# Patient Record
Sex: Female | Born: 1990 | Race: White | Hispanic: No | Marital: Single | Smoking: Never smoker
Health system: Southern US, Academic
[De-identification: ages and names within clinical notes are randomized; demographics above are authoritative.]

## PROBLEM LIST (undated history)

## (undated) DIAGNOSIS — J45909 Unspecified asthma, uncomplicated: Secondary | ICD-10-CM

## (undated) DIAGNOSIS — K219 Gastro-esophageal reflux disease without esophagitis: Secondary | ICD-10-CM

## (undated) DIAGNOSIS — C439 Malignant melanoma of skin, unspecified: Secondary | ICD-10-CM

## (undated) DIAGNOSIS — F411 Generalized anxiety disorder: Secondary | ICD-10-CM

## (undated) HISTORY — PX: SKIN CANCER EXCISION: SHX779

## (undated) HISTORY — PX: HX TONSILLECTOMY: SHX27

---
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
## 2021-01-07 IMAGING — CT CT SINUS W/O CONTRAST
3 series · 17 of 47 positions shown, 20 images · non-contrast
Comparison: None available.

﻿EXAM:  CT SINUS W/O CONTRAST
INDICATION: Chronic maxillary sinusitis, headaches.
TECHNIQUE: Axial noncontrast CT imaging of the paranasal sinuses was performed. Images were reviewed in multiple windows and projections. Exam was performed using 1 or more of the following dose reduction techniques: Automated exposure control, adjustment of the mA and/or kV according to patient size, or the use of iterative reconstruction technique.

[Series 8024: ax · axial · 0.27mm/px · z∈[+113,+206]mm · 11 of 55 slices shown, 14 images]
[im 4/55  brain]
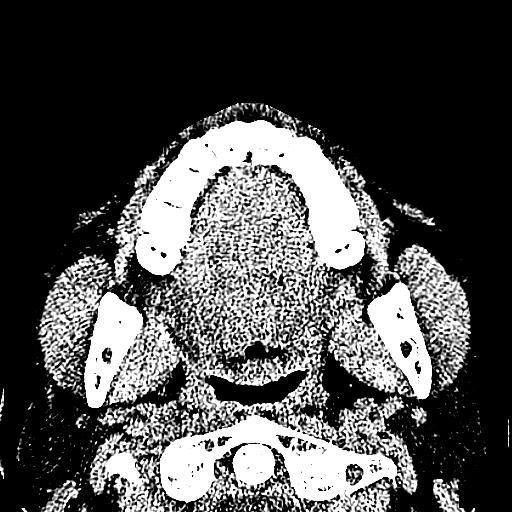
[im 4/55  bone]
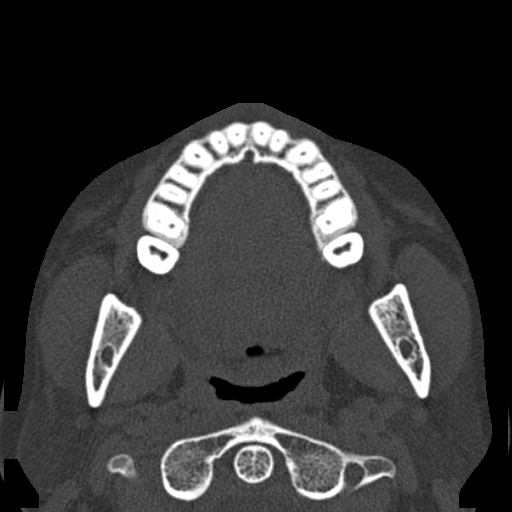
[im 8/55  bone]
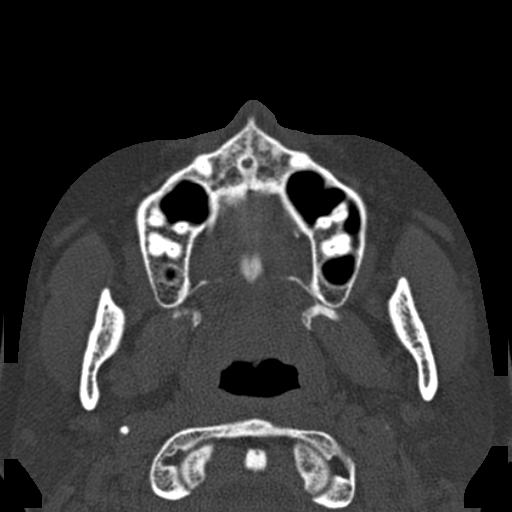
[im 14/55  bone]
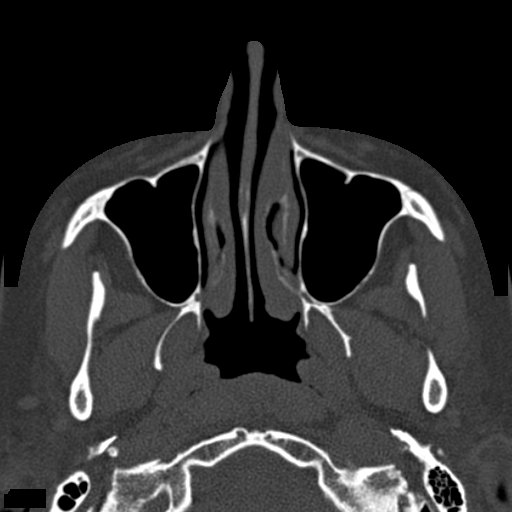
[im 17/55  bone]
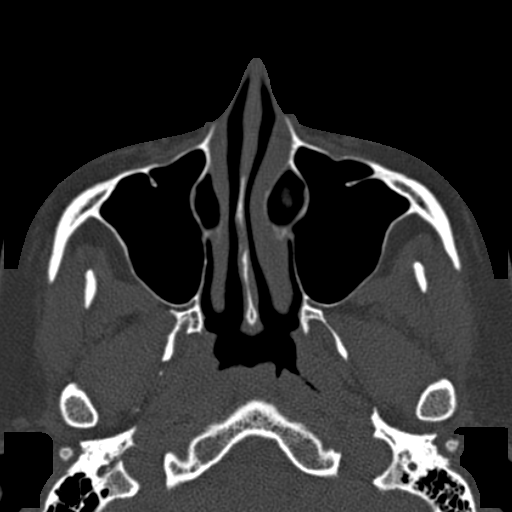
[im 23/55  brain]
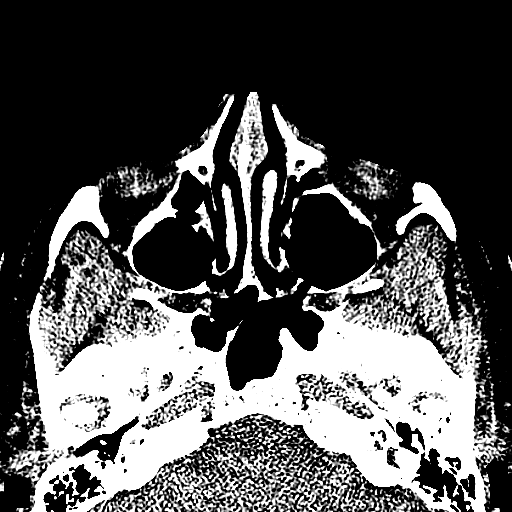
[im 23/55  bone]
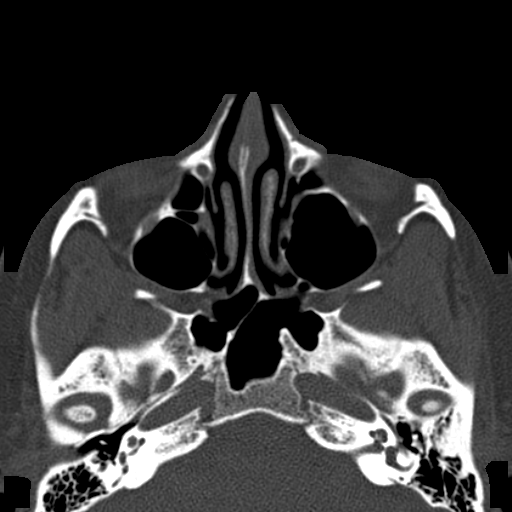
[im 28/55  bone]
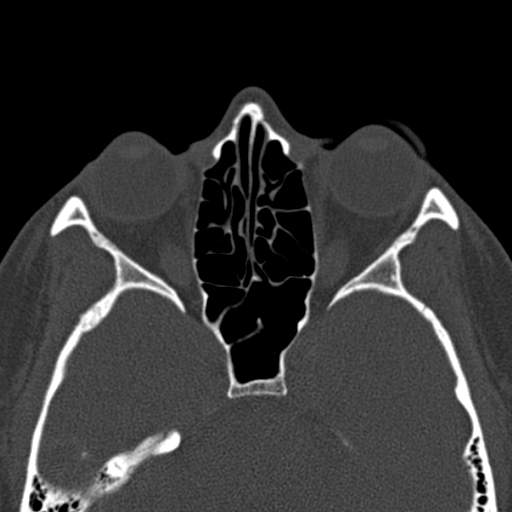
[im 32/55  bone]
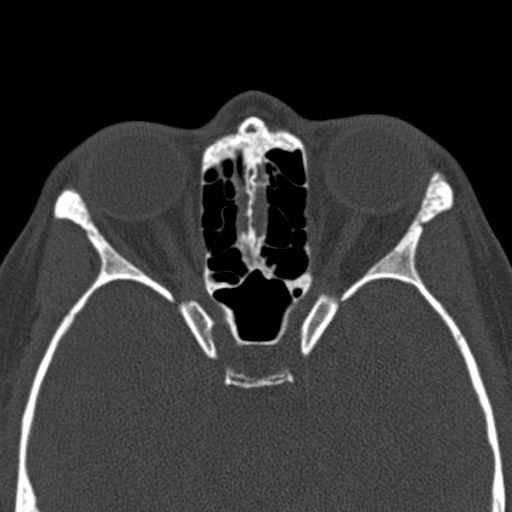
[im 38/55  bone]
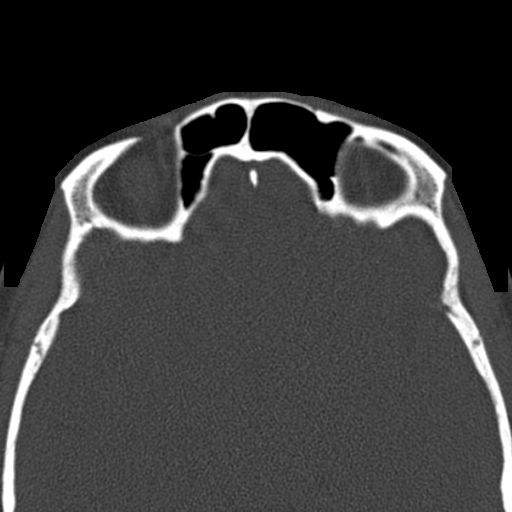
[im 41/55  brain]
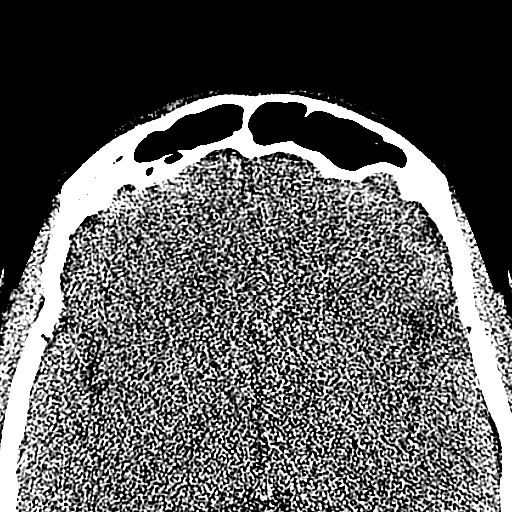
[im 41/55  bone]
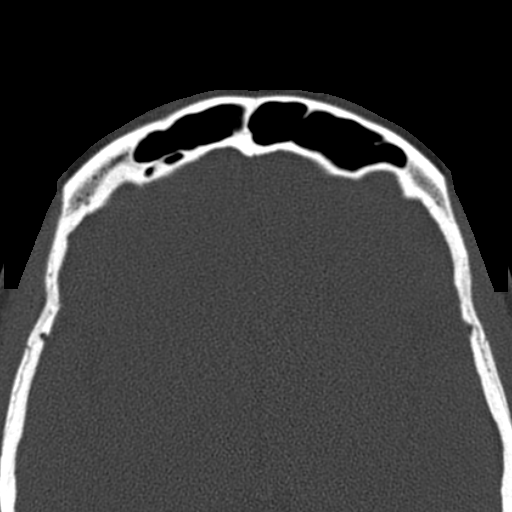
[im 47/55  bone]
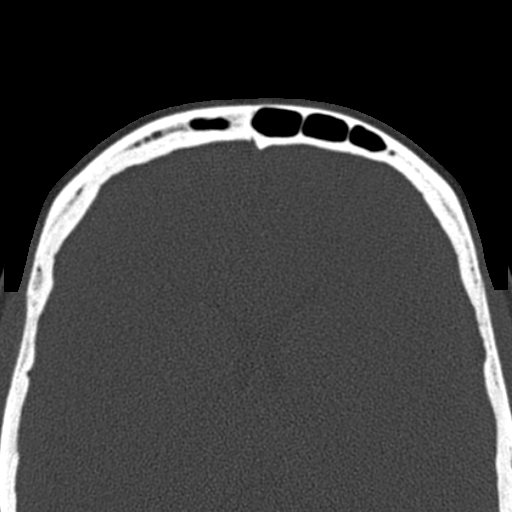
[im 51/55  bone]
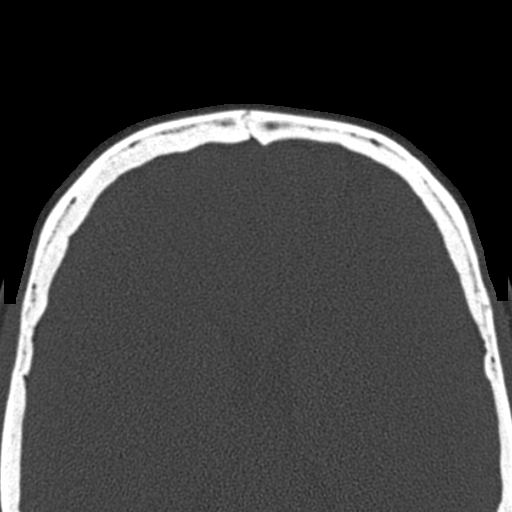

[Series 8025: cor · coronal · 0.25mm/px · 3 of 49 slices shown]
[im 17/49  bone]
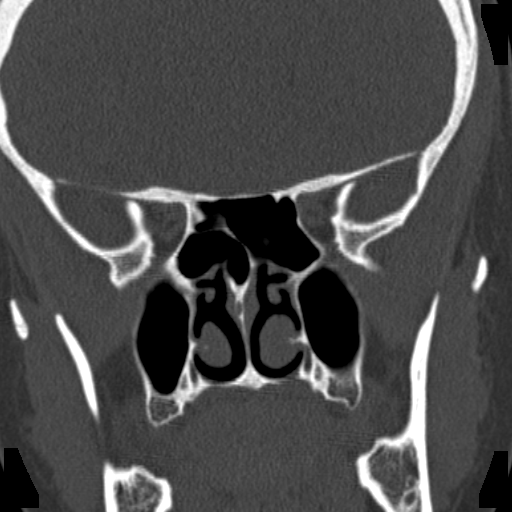
[im 22/49  bone]
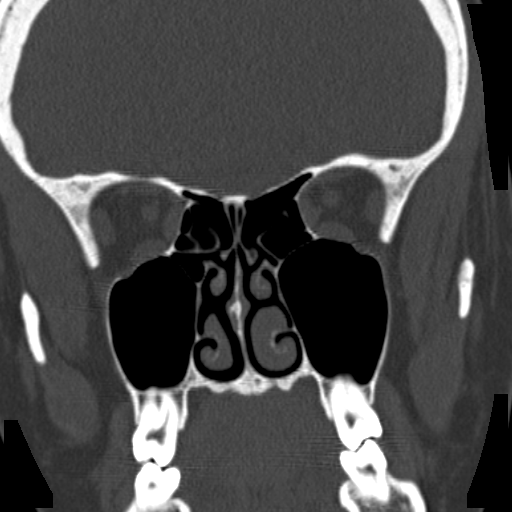
[im 27/49  bone]
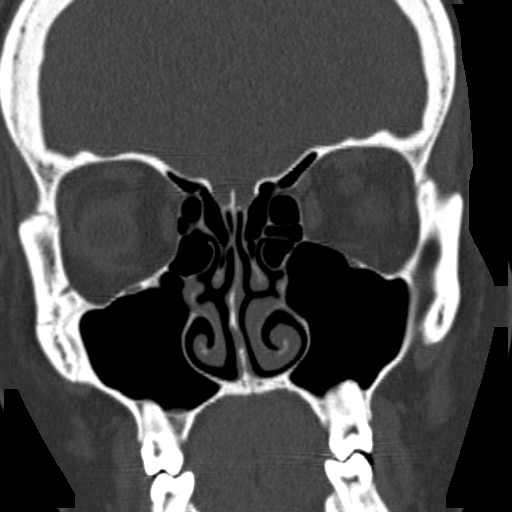

[Series 8026: sag · sagittal · 0.29mm/px · 3 of 59 slices shown]
[im 20/59  bone]
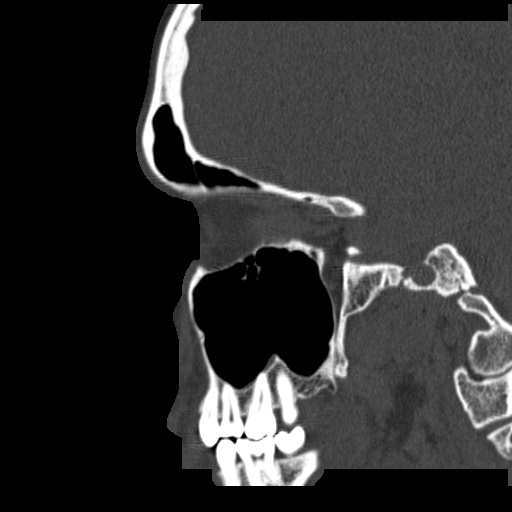
[im 30/59  bone]
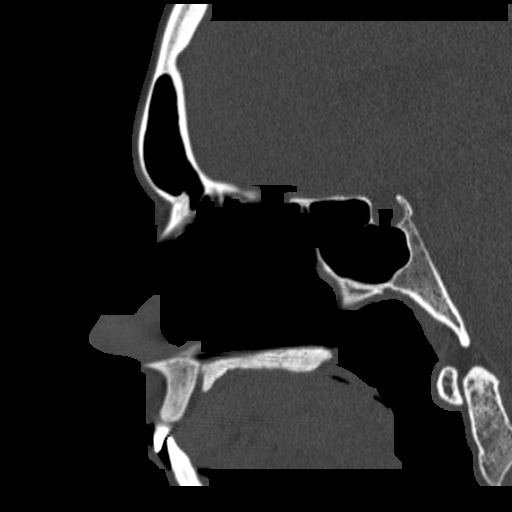
[im 39/59  bone]
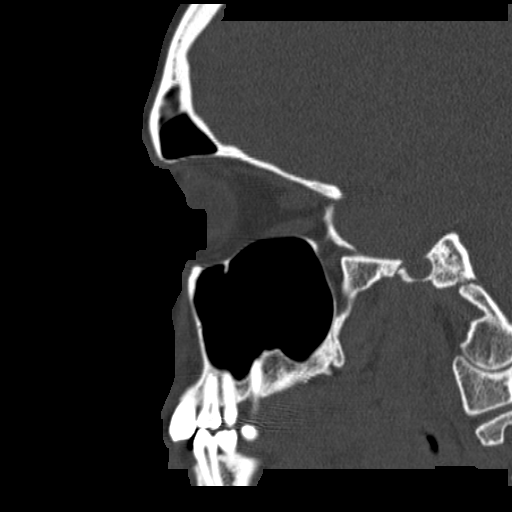

[17 of 47 positions shown; findings below may reference images not displayed]

FINDINGS: Frontal, ethmoid, maxillary and sphenoid sinuses are well aerated without significant opacification or mucoperiosteal thickening. Ostiomeatal units are also patent. There is no significant congestion of the nasal turbinates. Nasal septum is midline. Orbital contents appear unremarkable.
IMPRESSION: Unremarkable exam.

## 2021-03-24 IMAGING — MR MRI CERVICAL SPINE WITHOUT CONTRAST
4 of 5 series · 26 of 48 positions shown · IV contrast (gadolinium)
Comparison: None available.

﻿EXAM:  24050   MRI CERVICAL SPINE WITHOUT CONTRAST
INDICATION: Pain, recent injury.
TECHNIQUE: Multiplanar multisequential MRI of the cervical spine was performed without gadolinium contrast.

[Series 5: T2 · sagittal · 3.0mm · 0.78mm/px · 7 of 11 slices shown (1 of 2)]
[im 1/11]
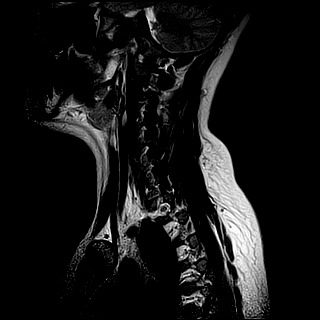
[im 2/11]
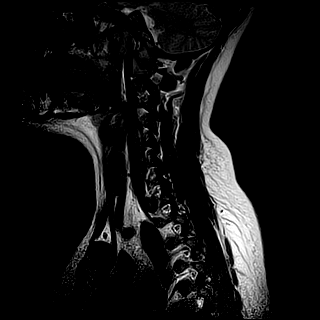
[im 4/11]
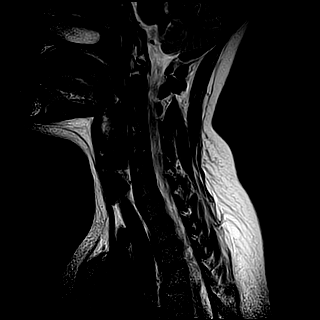
[im 6/11]
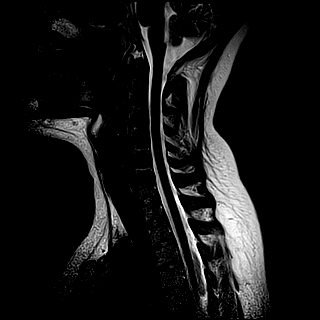
[im 7/11]
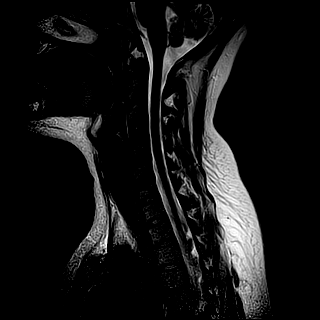
[im 9/11]
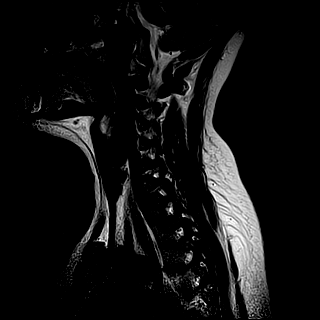
[im 11/11]
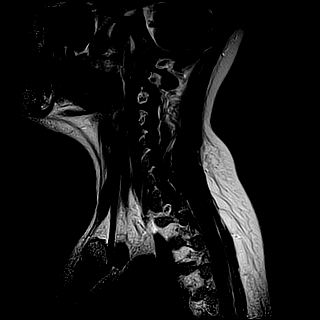

[Series 6: T1 · sagittal · 3.0mm · 0.49mm/px · 6 of 11 slices shown]
[im 1/11]
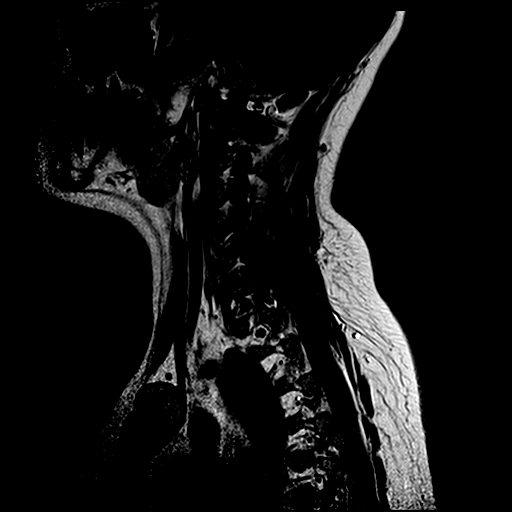
[im 2/11]
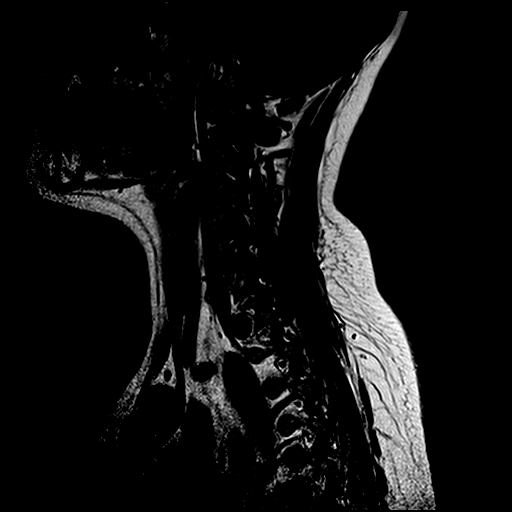
[im 4/11]
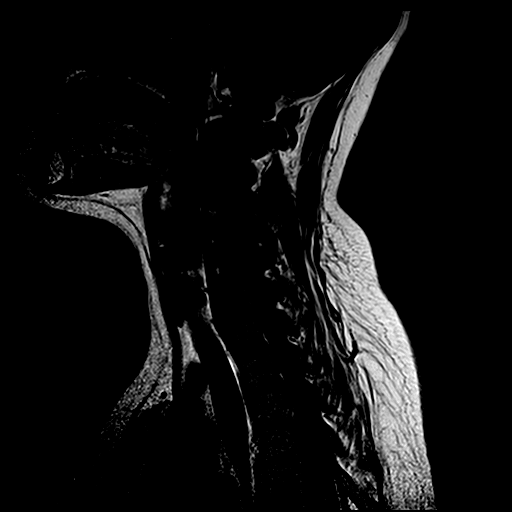
[im 6/11]
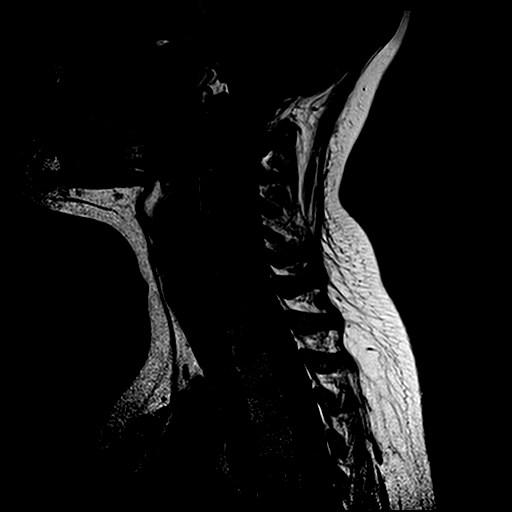
[im 7/11]
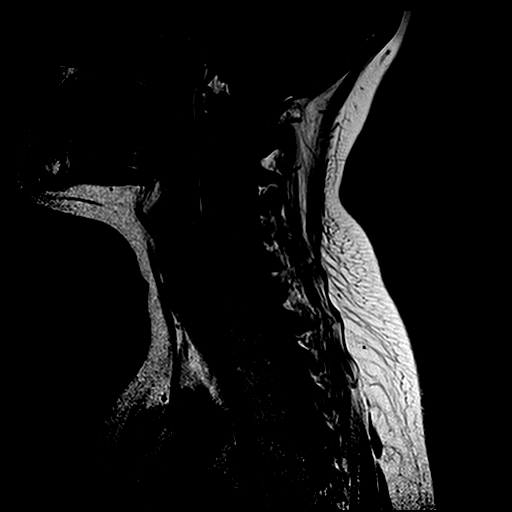
[im 9/11]
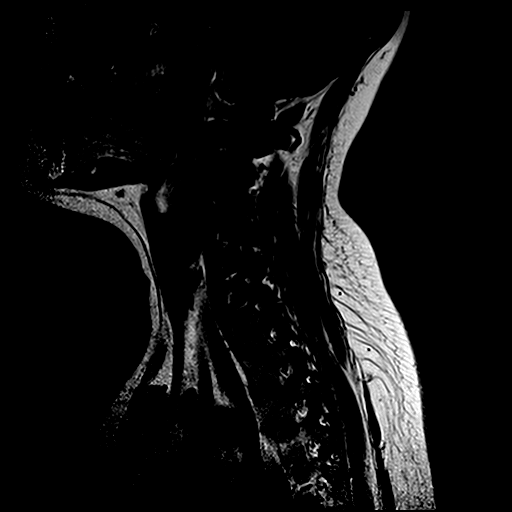

[Series 7: STIR · sagittal · 3.0mm · 0.49mm/px · 3 of 11 slices shown]
[im 2/11]
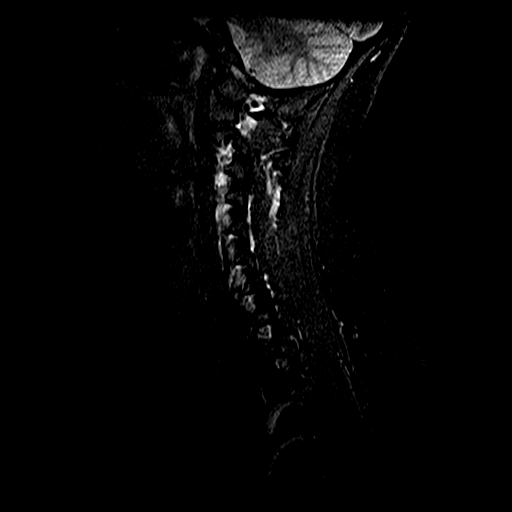
[im 6/11]
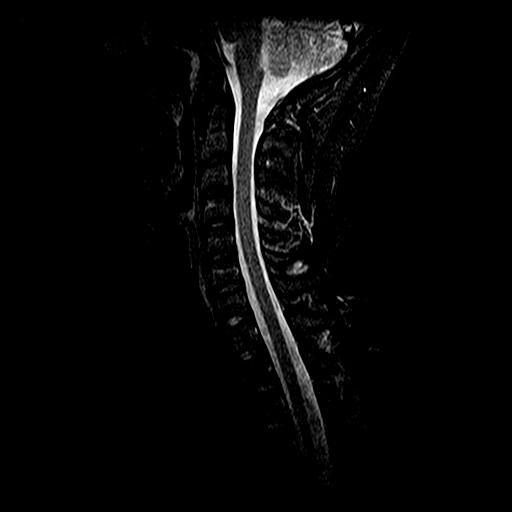
[im 9/11]
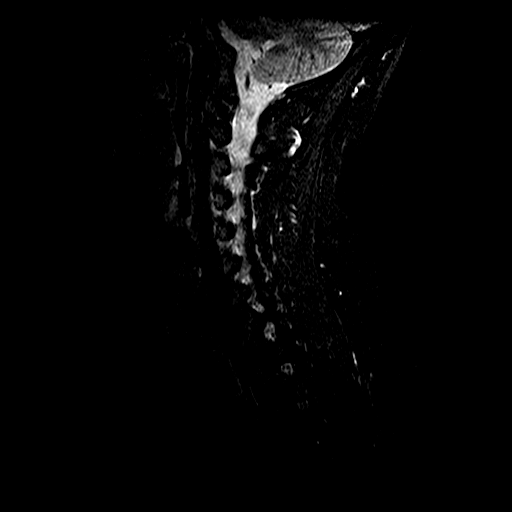

[Series 8: T2 · axial · 3.5mm · 0.40mm/px · z∈[-19,+63]mm · 10 of 26 slices shown (2 of 2)]
[im 2/26]
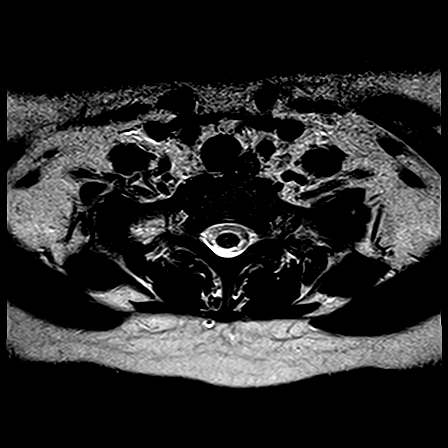
[im 4/26]
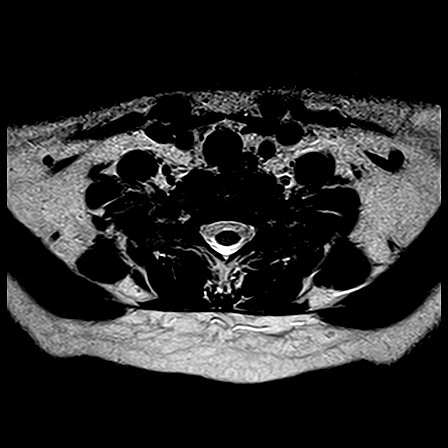
[im 6/26]
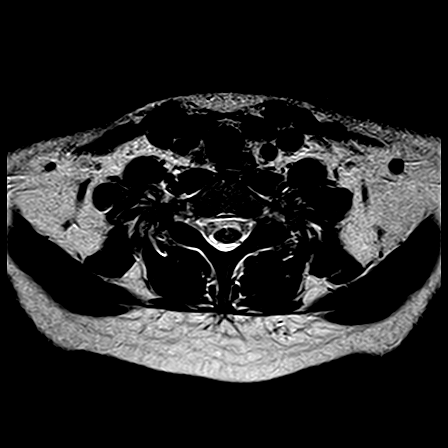
[im 9/26]
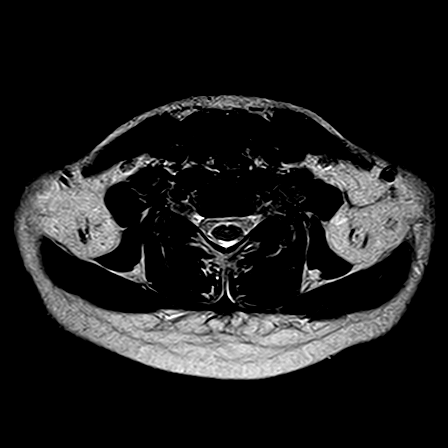
[im 12/26]
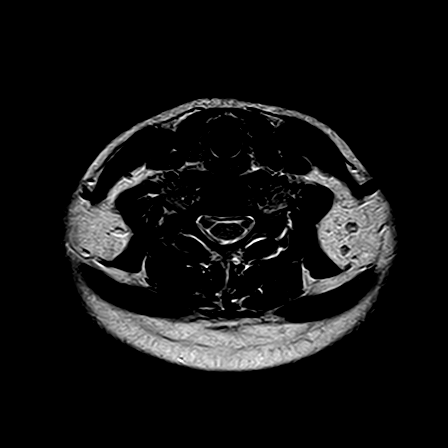
[im 14/26]
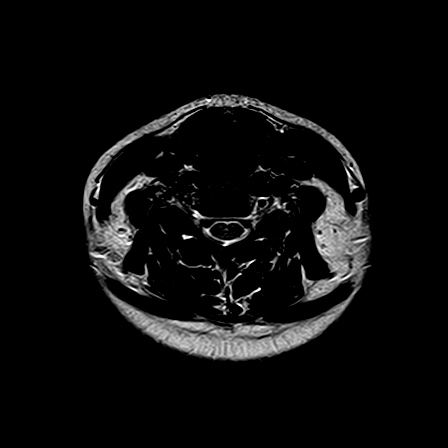
[im 16/26]
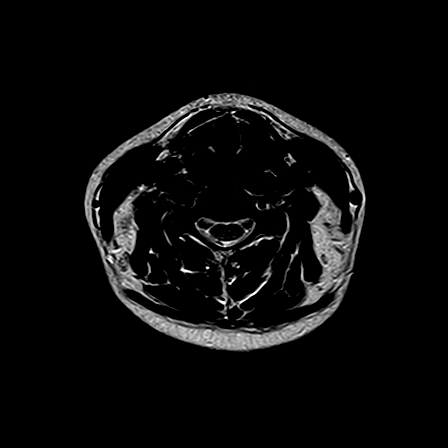
[im 19/26]
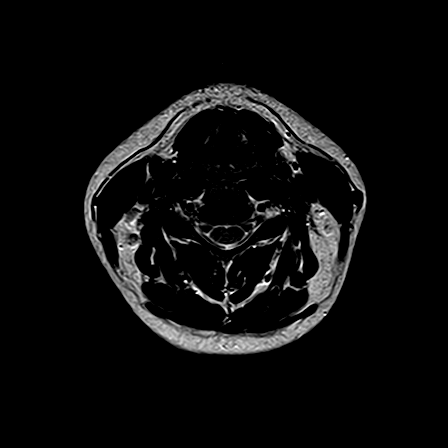
[im 22/26]
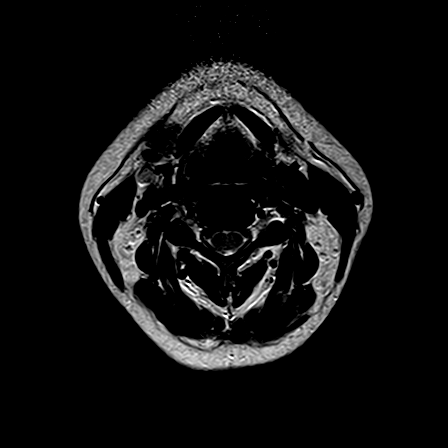
[im 26/26]
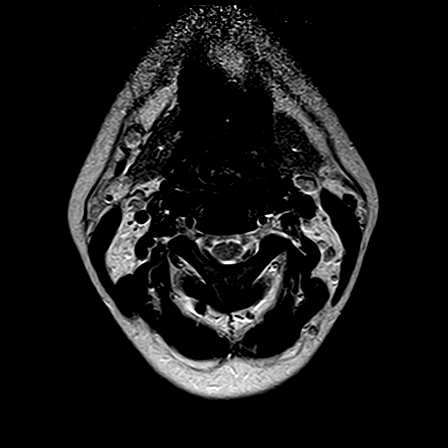

[26 of 48 positions shown; findings below may reference images not displayed]

FINDINGS: Vertebral bodies are normal in height, alignment and signal intensity.  There is no acute fracture or subluxation. Visualized spinal cord is normal in signal intensity without evidence of compression at any level. 

There is no significant disc herniation, spinal canal or neural foraminal stenosis at any level. 

Paraspinal soft tissues are also unremarkable.
IMPRESSION: Unremarkable exam.

## 2021-03-24 IMAGING — MR MRI THORACIC SPINE WITHOUT CONTRAST
4 of 5 series · 26 of 48 positions shown · IV contrast (gadolinium)
Comparison: None available.

﻿EXAM:  15163   MRI THORACIC SPINE WITHOUT CONTRAST
INDICATION: Back pain, lifting injury.
TECHNIQUE: Multiplanar multisequential MRI of the thoracic spine was performed without gadolinium contrast.

[Series 5: T2 · sagittal · 4.0mm · 0.78mm/px · 8 of 13 slices shown (1 of 2)]
[im 1/13]
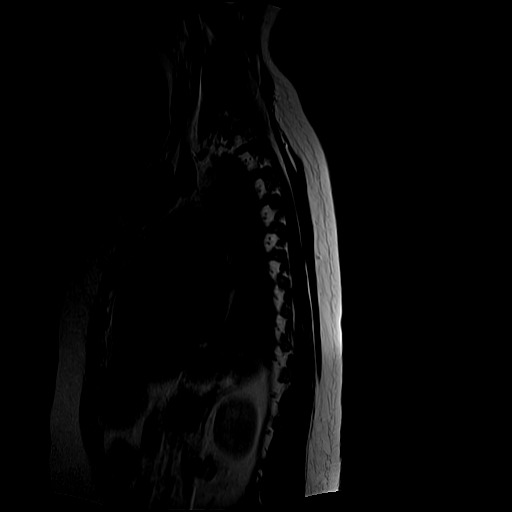
[im 2/13]
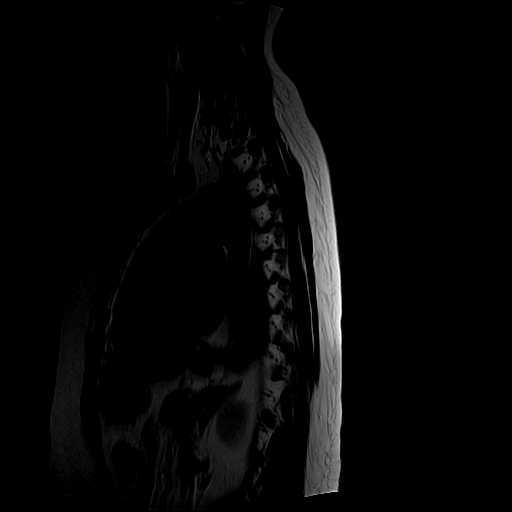
[im 5/13]
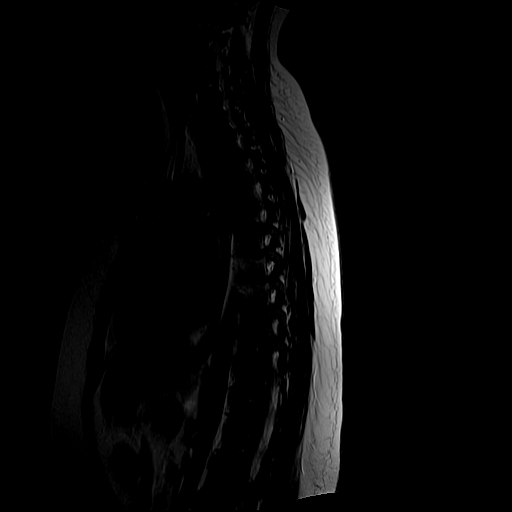
[im 6/13]
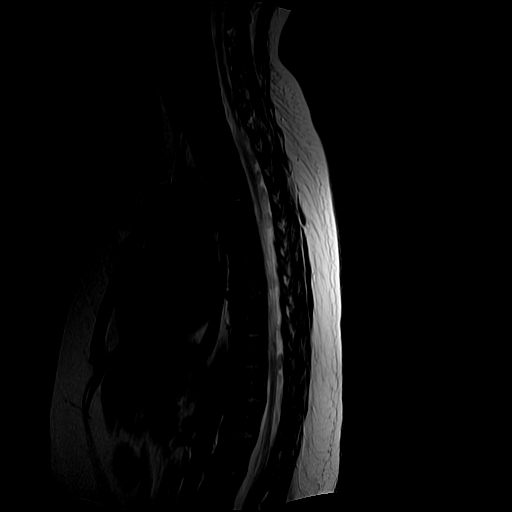
[im 7/13]
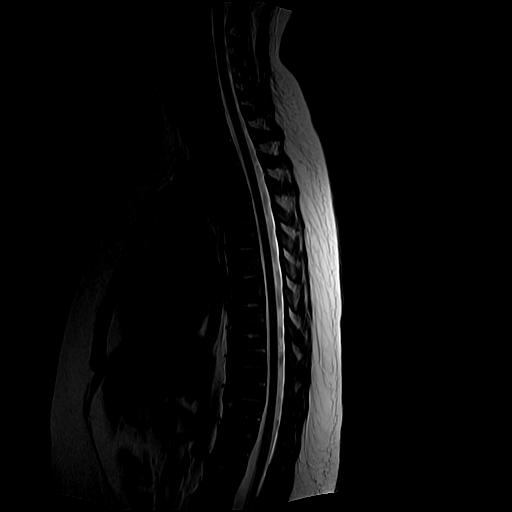
[im 9/13]
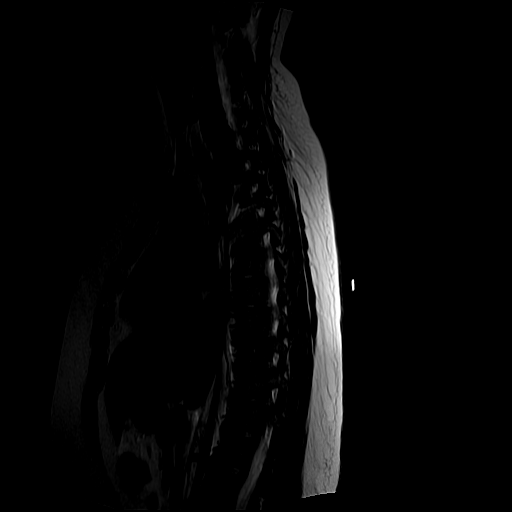
[im 11/13]
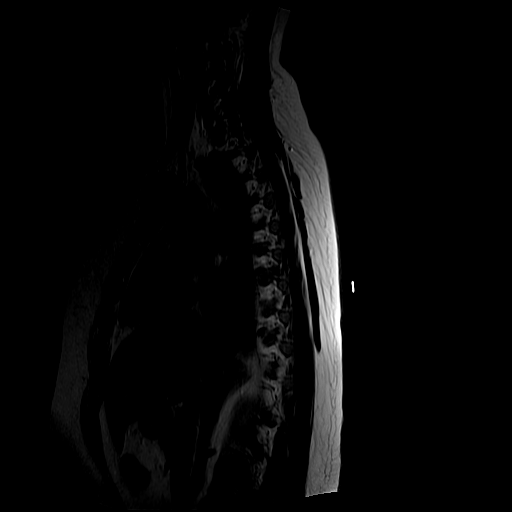
[im 13/13]
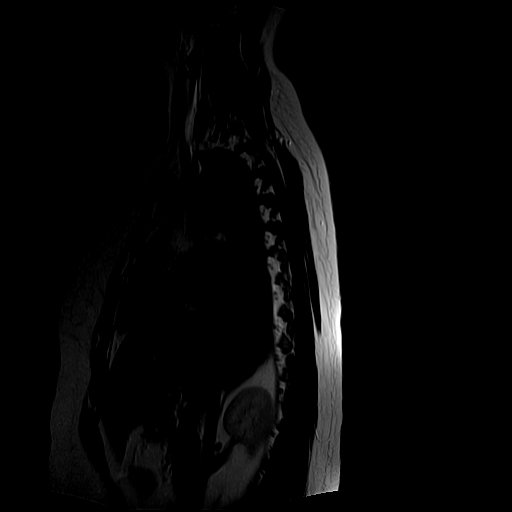

[Series 6: T1 · sagittal · 4.0mm · 0.78mm/px · 6 of 13 slices shown (1 of 2)]
[im 1/13]
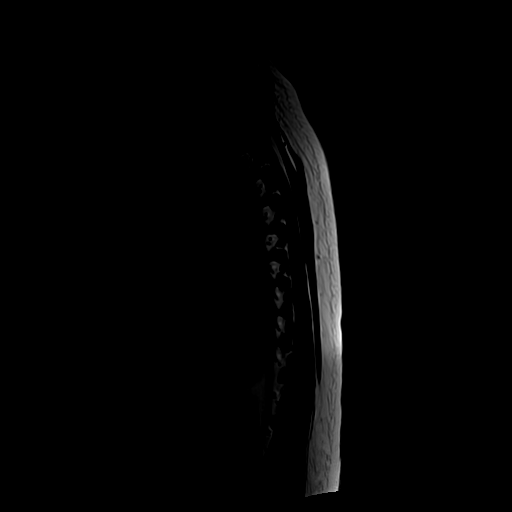
[im 2/13]
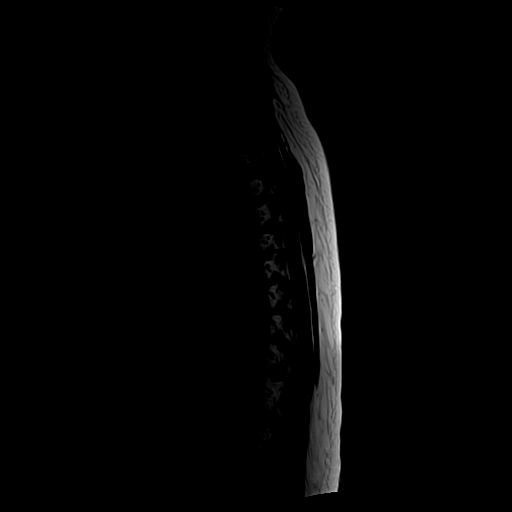
[im 5/13]
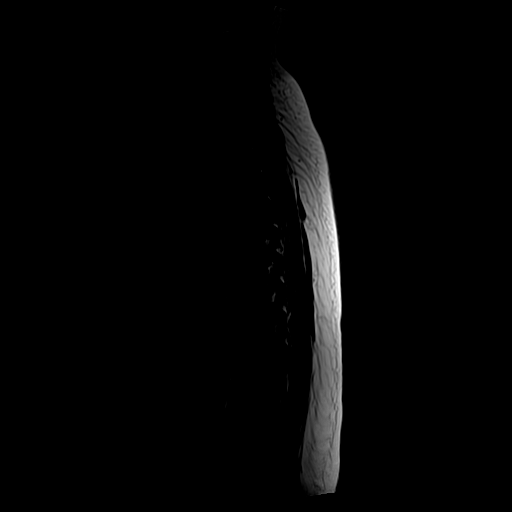
[im 6/13]
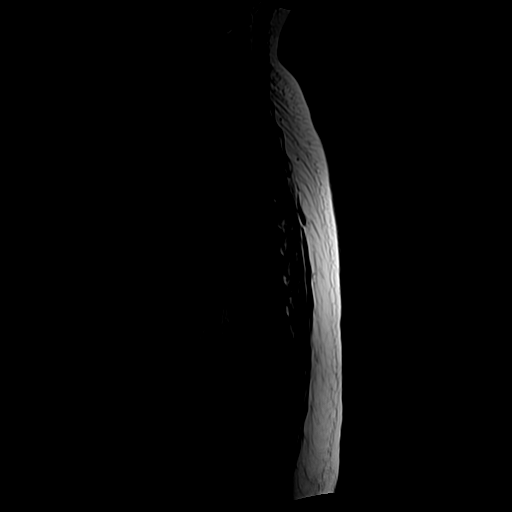
[im 7/13]
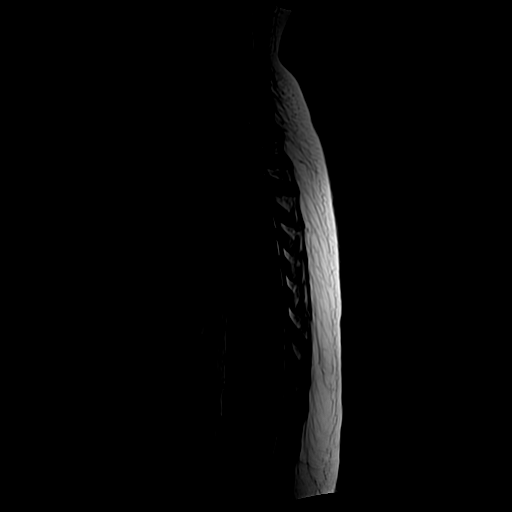
[im 11/13]
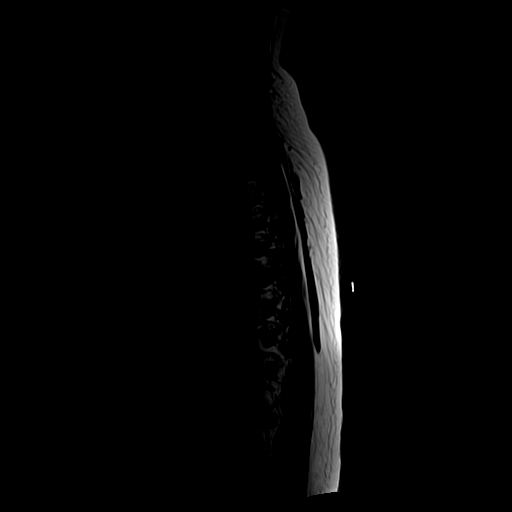

[Series 8: T2 · axial · 4.0mm · 0.62mm/px · z∈[-286,-101]mm · 9 of 12 slices shown (2 of 2)]
[im 1/12]
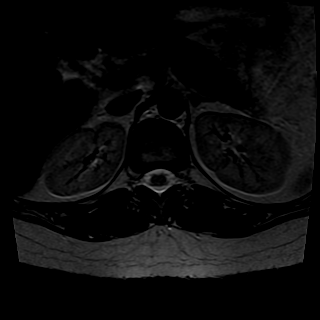
[im 2/12]
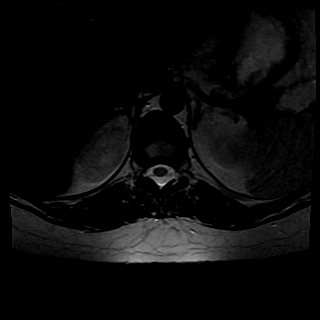
[im 3/12]
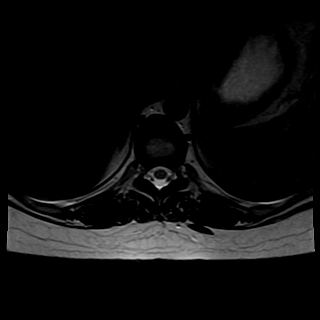
[im 5/12]
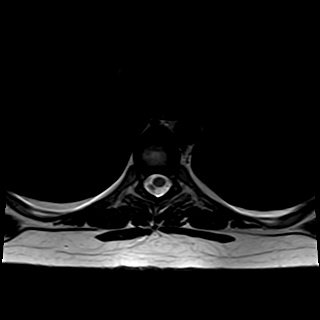
[im 6/12]
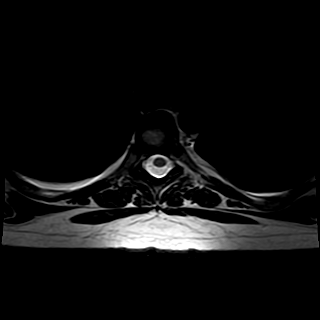
[im 7/12]
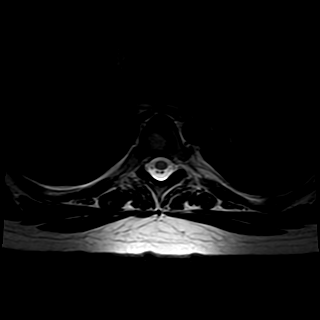
[im 9/12]
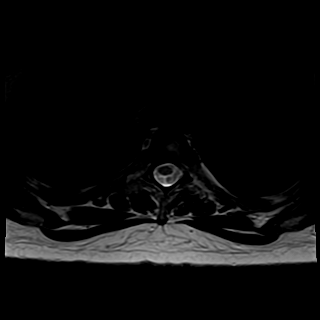
[im 10/12]
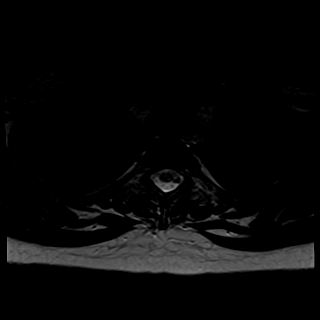
[im 12/12]
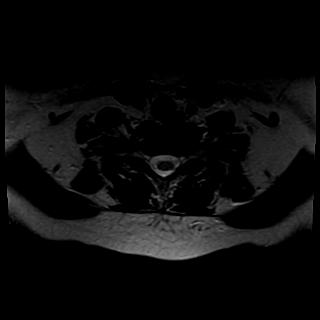

[Series 9: T1 · axial · 4.0mm · 0.62mm/px · z∈[-271,-140]mm · 3 of 12 slices shown (2 of 2)]
[im 2/12]
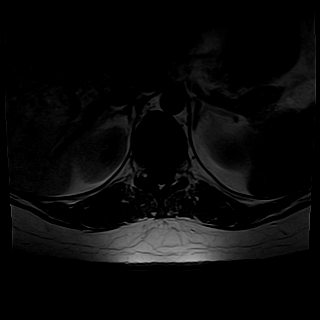
[im 6/12]
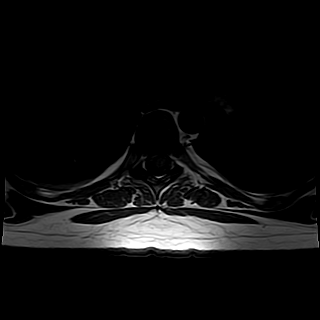
[im 10/12]
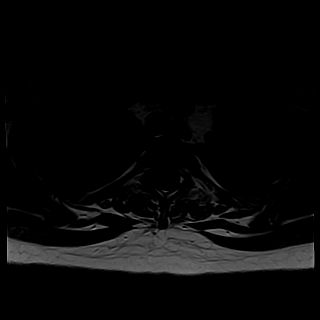

[26 of 48 positions shown; findings below may reference images not displayed]

FINDINGS: Vertebral bodies are normal in height, alignment and signal intensity. There is no acute fracture or subluxation. Visualized spinal cord is normal in signal intensity without evidence of compression at any level. 

There is no significant disc herniation, spinal canal or neural foraminal stenosis at any level. 

Paraspinal soft tissues are also unremarkable.  There is no pleural effusion.
IMPRESSION: Unremarkable exam.

## 2021-06-06 ENCOUNTER — Encounter (HOSPITAL_BASED_OUTPATIENT_CLINIC_OR_DEPARTMENT_OTHER): Payer: Self-pay

## 2021-06-06 ENCOUNTER — Other Ambulatory Visit: Payer: Self-pay

## 2021-06-06 ENCOUNTER — Emergency Department (HOSPITAL_BASED_OUTPATIENT_CLINIC_OR_DEPARTMENT_OTHER): Payer: Medicaid - Out of State

## 2021-06-06 ENCOUNTER — Emergency Department
Admission: EM | Admit: 2021-06-06 | Discharge: 2021-06-06 | Disposition: A | Discharge status: Home / Self Care | Payer: Medicaid - Out of State | Attending: Emergency Medicine | Admitting: Emergency Medicine

## 2021-06-06 DIAGNOSIS — I493 Ventricular premature depolarization: Secondary | ICD-10-CM | POA: Insufficient documentation

## 2021-06-06 DIAGNOSIS — F418 Other specified anxiety disorders: Secondary | ICD-10-CM

## 2021-06-06 DIAGNOSIS — R079 Chest pain, unspecified: Secondary | ICD-10-CM | POA: Insufficient documentation

## 2021-06-06 DIAGNOSIS — R002 Palpitations: Secondary | ICD-10-CM | POA: Insufficient documentation

## 2021-06-06 DIAGNOSIS — R000 Tachycardia, unspecified: Secondary | ICD-10-CM | POA: Insufficient documentation

## 2021-06-06 DIAGNOSIS — F419 Anxiety disorder, unspecified: Secondary | ICD-10-CM | POA: Insufficient documentation

## 2021-06-06 DIAGNOSIS — M79602 Pain in left arm: Secondary | ICD-10-CM

## 2021-06-06 HISTORY — DX: Unspecified asthma, uncomplicated: J45.909

## 2021-06-06 HISTORY — DX: Malignant melanoma of skin, unspecified (CMS HCC): C43.9

## 2021-06-06 HISTORY — DX: Gastro-esophageal reflux disease without esophagitis: K21.9

## 2021-06-06 HISTORY — DX: Generalized anxiety disorder: F41.1

## 2021-06-06 LAB — CBC WITH DIFF
BASOPHIL #: 0.03 10*3/uL (ref 0.00–0.30)
BASOPHIL %: 0 % (ref 0–3)
EOSINOPHIL #: 0.26 10*3/uL (ref 0.00–0.80)
EOSINOPHIL %: 2 % (ref 0–7)
HCT: 49 % — ABNORMAL HIGH (ref 37.0–47.0)
HGB: 16.3 g/dL — ABNORMAL HIGH (ref 12.5–16.0)
LYMPHOCYTE #: 3.1 10*3/uL (ref 1.10–5.00)
LYMPHOCYTE %: 29 % (ref 25–45)
MCH: 30.5 pg (ref 27.0–32.0)
MCHC: 33.3 g/dL (ref 32.0–36.0)
MCV: 91.5 fL (ref 78.0–99.0)
MONOCYTE #: 0.86 10*3/uL (ref 0.00–1.30)
MONOCYTE %: 8 % (ref 0–12)
MPV: 9 fL (ref 7.4–10.4)
NEUTROPHIL #: 6.29 10*3/uL (ref 1.80–8.40)
NEUTROPHIL %: 60 % (ref 40–76)
PLATELETS: 320 10*3/uL (ref 140–440)
RBC: 5.35 10*6/uL (ref 4.20–5.40)
RDW: 15.3 % — ABNORMAL HIGH (ref 11.6–14.8)
WBC: 10.5 10*3/uL (ref 4.0–10.5)

## 2021-06-06 LAB — D-DIMER: D-DIMER: 473 ng/mL DDU — ABNORMAL HIGH (ref <=232)

## 2021-06-06 LAB — COMPREHENSIVE METABOLIC PANEL, NON-FASTING
ALBUMIN/GLOBULIN RATIO: 1.2 (ref 0.8–1.4)
ALBUMIN: 3.7 g/dL (ref 3.4–5.0)
ALKALINE PHOSPHATASE: 73 U/L (ref 46–116)
ALT (SGPT): 31 U/L (ref <=78)
ANION GAP: 9 mmol/L — ABNORMAL LOW (ref 10–20)
AST (SGOT): 21 U/L (ref 15–37)
BILIRUBIN TOTAL: 0.4 mg/dL (ref 0.2–1.0)
BUN/CREA RATIO: 16
BUN: 16 mg/dL (ref 7–18)
CALCIUM, CORRECTED: 9.2 mg/dL
CALCIUM: 8.9 mg/dL (ref 8.5–10.1)
CHLORIDE: 106 mmol/L (ref 98–107)
CO2 TOTAL: 24 mmol/L (ref 21–32)
CREATININE: 0.97 mg/dL (ref 0.55–1.02)
ESTIMATED GFR: 81 mL/min/{1.73_m2} (ref >59)
GLOBULIN: 3.2
GLUCOSE: 86 mg/dL (ref 74–106)
OSMOLALITY, CALCULATED: 278 mOsm/kg (ref 270–290)
POTASSIUM: 3.6 mmol/L (ref 3.5–5.1)
PROTEIN TOTAL: 6.9 g/dL (ref 6.4–8.2)
SODIUM: 139 mmol/L (ref 136–145)

## 2021-06-06 LAB — TROPONIN-I: TROPONIN I: <2 ng/L (ref <15)

## 2021-06-06 LAB — HCG QUALITATIVE PREGNANCY, SERUM: PREGNANCY, SERUM QUALITATIVE: NEGATIVE

## 2021-06-06 MED ORDER — ASPIRIN 81 MG CHEWABLE TABLET
324.0000 mg | CHEWABLE_TABLET | ORAL | Status: DC
Start: 2021-06-06 — End: 2021-06-06
  Administered 2021-06-06: 0 mg via ORAL

## 2021-06-06 NOTE — ED Triage Notes (Signed)
PT states chest pains since mid day yesterday, left sided over left breast that does not radiate. PT states at times she feels palpitations as well. Pain worsens with exertion. Hx chest pains and has been worked up, has been told it could be her anxiety disorder as well. Denies n/v or cough.

## 2021-06-06 NOTE — Discharge Instructions (Signed)
You were seen emergency department for chest pain.  Your evaluation did not reveal critical condition requiring hospitalization.  Please follow-up primary care ideally within next 2-3 days.  Return to emergency department to get worsening chest pain shortness breath or any other concerning symptoms.  Thank you for visiting [facility].

## 2021-06-06 NOTE — ED Nurses Note (Signed)
PT dc'd to home with self via ambulatory. DC instructions discussed with teach back to confirm understanding. Dc'd iv from pt left ac. Cath intact. Pressure dressing applied. Bleeding controlled.

## 2021-06-06 NOTE — ED Provider Notes (Signed)
[facility], [facility] Emergency Department  ED Primary Provider Note  HPI:  Leslie Wells is a 31 y.o. female     Patient complains of palpitations and pain under her left under arm.  This has been ongoing for about 2 days.  She is not short of breath.  Denies nausea vomiting belly pain.  Patient states she does have a history of anxiety but believes she may have POTS.  She is never received a formal diagnosis.  She is been previously evaluated by Cardiology for arrhythmia and found to have few PVCs.  Patient has taken Vistaril today with little relief of symptoms.  She is no history of venous thromboembolism.  Denies long periods of immobilization or use of hormonal birth control.  Patient is COVID vaccinated.  Full code.    ROS review and negative aside from stated in HPI.    Physical Exam:  ED Triage Vitals [06/06/21 2000]   BP (Non-Invasive) 113/81   Heart Rate (!) 117   Respiratory Rate 18   Temperature 36.7 C (98.1 F)   SpO2 100 %   Weight 77.1 kg (170 lb)   Height 1.6 m ('5\' 3"' )     No acute distress.  Patient awake alert oriented x3.  Mood is appropriate.  Pupils 3 mm equal round reactive.  Extraocular movements are intact.  Oropharynx is clear.  Mucous membranes moist.  Trachea midline.  Neck is supple.  Heart has regular rate and rhythm without significant murmurs rubs or gallops.  Palpation of left-sided chest wall/axilla does partially reproduce patient's symptoms.  Lungs are clear to auscultation.  Abdomen soft nontender, nondistended.  Moving all extremities without difficulty.  No rash no edema.      Patient data:  Labs Ordered/Reviewed   COMPREHENSIVE METABOLIC PANEL, NON-FASTING - Abnormal; Notable for the following components:       Result Value    ANION GAP 9 (*)     All other components within normal limits    Narrative:     Estimated Glomerular Filtration Rate (eGFR) is calculated using the CKD-EPI (2021) equation, intended for patients 74 years of age and  older. If gender is not documented or "unknown", there will be no eGFR calculation.   CBC WITH DIFF - Abnormal; Notable for the following components:    HGB 16.3 (*)     HCT 49.0 (*)     RDW 15.3 (*)     All other components within normal limits   TROPONIN-I - Normal    Narrative:     Values received on females ranging between 12-15 ng/L MUST include the next serial troponin to review changes in the delta differences as the reference range for the Access II chemistry analyzer is lower than the established reference range.     HCG QUALITATIVE PREGNANCY, SERUM - Normal   CBC/DIFF    Narrative:     The following orders were created for panel order CBC/DIFF.  Procedure                               Abnormality         Status                     ---------                               -----------         ------  CBC WITH CVKF[[id number]]                Abnormal            Final result                 Please view results for these tests on the individual orders.   D-DIMER     XR AP MOBILE CHEST   Final Result by Edi, Radresults In (05/06 2100)   NEGATIVE CHEST         Radiologist location ID: [id number]             EKG read by me shows sinus normal axis, QTC is 447.  I do not appreciate significant ST or T-wave changes no delta waves are S1 Q3 T3 pattern.    MDM:  Chest pain palpitations.  Low suspicion for ACS PE malignant arrhythmia.  Anxiety is likely a strong contributor.  Possible GERD.  Vitals are within normal limits.  Benign exam.  EKG nonischemic.  Chest x-ray unremarkable.  Troponin within normal limits.  Patient  have a slightly elevated dimer to 473 but it is not at a level significant for embolic phenomenon..  PE is highly unlikely.  I did review these findings with the patient and answered her questions.  Patient took aspirin prior to arrival.  I have asked her to follow closely with primary care and Cardiology and I gave strict return to ED instructions in both a verbal and written  manner.  Patient comfortable with discharge.    Data Unavailable  Clinical Impression   None     Medications Administered in the ED   aspirin chewable tablet 324 mg (0 mg Oral Not Given 06/06/21 2100)        Current Discharge Medication List      CONTINUE these medications - NO CHANGES were made during your visit.      Details   busPIRone 30 mg Tablet  Commonly known as: BUSPAR   30 mg, Oral, DAILY  Refills: 0     cetirizine 10 mg Tablet  Commonly known as: ZYRTEC   10 mg, Oral, DAILY  Refills: 0     hydrOXYzine pamoate 25 mg Capsule  Commonly known as: VISTARIL   25 mg, Oral, 3 TIMES DAILY PRN  Refills: 0     magnesium oxide 400 mg Tablet  Commonly known as: MAG-OX   400 mg, Oral, 2 TIMES DAILY  Refills: 0     montelukast 10 mg Tablet  Commonly known as: SINGULAIR   10 mg, Oral, EVERY EVENING  Refills: 0     omeprazole 20 mg Capsule, Delayed Release(E.C.)  Commonly known as: PRILOSEC   20 mg, Oral, DAILY  Refills: 0     topiramate 25 mg Tablet  Commonly known as: TOPAMAX   25 mg, Oral, 2 TIMES DAILY  Refills: 0     venlafaxine 150 mg Capsule, Sust. Release 24 hr  Commonly known as: EFFEXOR XR   150 mg, Oral, DAILY  Refills: 0

## 2021-06-06 NOTE — ED Nurses Note (Signed)
Pt reports she self took 650 mg of ASA today PTA

## 2021-06-07 LAB — ECG 12 LEAD
Atrial Rate: 104 {beats}/min
Calculated P Axis: 47 degrees
Calculated R Axis: 65 degrees
Calculated T Axis: 48 degrees
PR Interval: 142 ms
QRS Duration: 82 ms
QT Interval: 340 ms
QTC Calculation: 447 ms
Ventricular rate: 104 {beats}/min

## 2021-07-10 ENCOUNTER — Encounter (HOSPITAL_BASED_OUTPATIENT_CLINIC_OR_DEPARTMENT_OTHER): Payer: Self-pay

## 2021-07-10 ENCOUNTER — Other Ambulatory Visit: Payer: Self-pay

## 2021-07-10 ENCOUNTER — Emergency Department
Admission: EM | Admit: 2021-07-10 | Discharge: 2021-07-10 | Disposition: A | Discharge status: Home / Self Care | Payer: Medicaid - Out of State | Attending: Emergency Medicine | Admitting: Emergency Medicine

## 2021-07-10 DIAGNOSIS — N939 Abnormal uterine and vaginal bleeding, unspecified: Secondary | ICD-10-CM | POA: Insufficient documentation

## 2021-07-10 DIAGNOSIS — Z3202 Encounter for pregnancy test, result negative: Secondary | ICD-10-CM | POA: Insufficient documentation

## 2021-07-10 LAB — CBC WITH DIFF
BASOPHIL #: 0.03 10*3/uL (ref 0.00–0.30)
BASOPHIL %: 0 % (ref 0–3)
EOSINOPHIL #: 0.14 10*3/uL (ref 0.00–0.80)
EOSINOPHIL %: 2 % (ref 0–7)
HCT: 41.3 % (ref 37.0–47.0)
HGB: 13.7 g/dL (ref 12.5–16.0)
LYMPHOCYTE #: 2.49 10*3/uL (ref 1.10–5.00)
LYMPHOCYTE %: 34 % (ref 25–45)
MCH: 31 pg (ref 27.0–32.0)
MCHC: 33.2 g/dL (ref 32.0–36.0)
MCV: 93.4 fL (ref 78.0–99.0)
MONOCYTE #: 0.55 10*3/uL (ref 0.00–1.30)
MONOCYTE %: 7 % (ref 0–12)
MPV: 9.4 fL (ref 7.4–10.4)
NEUTROPHIL #: 4.22 10*3/uL (ref 1.80–8.40)
NEUTROPHIL %: 57 % (ref 40–76)
PLATELETS: 297 10*3/uL (ref 140–440)
RBC: 4.42 10*6/uL (ref 4.20–5.40)
RDW: 14.9 % — ABNORMAL HIGH (ref 11.6–14.8)
WBC: 7.4 10*3/uL (ref 4.0–10.5)

## 2021-07-10 LAB — COMPREHENSIVE METABOLIC PANEL, NON-FASTING
ALBUMIN/GLOBULIN RATIO: 1.1 (ref 0.8–1.4)
ALBUMIN: 3.5 g/dL (ref 3.4–5.0)
ALKALINE PHOSPHATASE: 71 U/L (ref 46–116)
ALT (SGPT): 139 U/L — ABNORMAL HIGH (ref <=78)
ANION GAP: 6 mmol/L — ABNORMAL LOW (ref 10–20)
AST (SGOT): 48 U/L — ABNORMAL HIGH (ref 15–37)
BILIRUBIN TOTAL: 0.5 mg/dL (ref 0.2–1.0)
BUN/CREA RATIO: 14
BUN: 11 mg/dL (ref 7–18)
CALCIUM, CORRECTED: 9.1 mg/dL
CALCIUM: 8.6 mg/dL (ref 8.5–10.1)
CHLORIDE: 104 mmol/L (ref 98–107)
CO2 TOTAL: 28 mmol/L (ref 21–32)
CREATININE: 0.78 mg/dL (ref 0.55–1.02)
ESTIMATED GFR: 105 mL/min/{1.73_m2} (ref >59)
GLOBULIN: 3.1
GLUCOSE: 85 mg/dL (ref 74–106)
OSMOLALITY, CALCULATED: 274 mOsm/kg (ref 270–290)
POTASSIUM: 3.9 mmol/L (ref 3.5–5.1)
PROTEIN TOTAL: 6.6 g/dL (ref 6.4–8.2)
SODIUM: 138 mmol/L (ref 136–145)

## 2021-07-10 LAB — URINALYSIS, MICROSCOPIC: RBCS: >50 /hpf — AB

## 2021-07-10 LAB — URINALYSIS, MACROSCOPIC
BILIRUBIN: NEGATIVE mg/dL
GLUCOSE: NEGATIVE mg/dL
KETONES: NEGATIVE mg/dL
NITRITE: NEGATIVE
PH: 7 (ref 4.6–8.0)
PROTEIN: NEGATIVE mg/dL
SPECIFIC GRAVITY: <=1.005 (ref 1.003–1.035)
UROBILINOGEN: 0.2 mg/dL (ref 0.2–1.0)

## 2021-07-10 LAB — HCG, PLASMA OR SERUM QUANTITATIVE, PREGNANCY: HCG QUANTITATIVE PREGNANCY: 0 IU/L (ref <=5)

## 2021-07-10 NOTE — ED Triage Notes (Signed)
4 week OB from home pregnancy test.   States woke this am with vaginal bleeding.  States breast had been swollen and aren't this am.   Lower abdominal cramping.

## 2021-07-10 NOTE — Discharge Instructions (Signed)

## 2021-07-10 NOTE — ED Provider Notes (Signed)
[facility]  ED Primary Provider Note  Patient Name: KATRIANNA FRIESENHAHN  Patient Age: 31 y.o.  Date of Birth: 1990/06/26    Chief Complaint: Vaginal Bleeding - Pregnant        History of Present Illness       Raeanne Deschler Flesch is a 31 y.o. female who had concerns including Vaginal Bleeding - Pregnant.  This patient is a G2P1 at approximately 4 weeks without a confirmed intrauterine pregnancy.  Patient states she is having some lower abdominal related discomfort, and a positive Clear Blue home pregnancy test.  The patient states over the preceding 4 weeks, she has noticed some breast tenderness, which is absent this morning.  She follows with Dr. Oletta Lamas for obstetrics and gynecology care.  Her previous pregnancies reportedly uncomplicated, and she has a 63-year-old son at home.        Review of Systems     No other overt Review of Systems are noted to be positive except noted in the HPI.      Historical Data   History Reviewed This Encounter: Medical History  Surgical History  Family History  Social History        Physical Exam   ED Triage Vitals [07/10/21 0753]   BP (Non-Invasive) 97/69   Heart Rate 91   Respiratory Rate 18   Temperature 36.2 C (97.2 F)   SpO2 98 %   Weight 77.1 kg (170 lb)   Height 1.524 m (5')         Nursing notes reviewed for what could be assessed. Past Medical, Surgical, and Social history reviewed for what has been completed. Exam limited in the setting of personal protective equipment.    Constitutional: NAD. Well-Developed. Well Nourished.  Head: Normocephalic, atraumatic.  Ears:  Normal to exterior evaluation  Eyes: EOM grossly intact, conjunctiva normal.  Neck: Supple  Cardiovascular: Regular Rate and Rhythm, extremities well perfused.  Pulmonary/Chest: No respiratory distress. Lungs are symmetric to auscultation bilaterally.  Abdominal: Soft, no overt focal tenderness, non-distended. Non peritoneal, no rebound, no guarding.  MSK: No Lower  Extremity Edema.  Skin: Warm, dry, and intact  Neuro: Appropriate, CN II-XII grossly intact for what can be assessed.  Psych: Pleasant    GU:  Deferred          Procedures      Patient Data   Labs Ordered/Reviewed - No data to display    No orders to display       Medical Decision Making          MDM      Studies Assessed:  Lab      MDM Narrative:  This patient is a 31 year old female who presents with vaginal bleeding with a positive pregnancy test.  During workup, the patient's blood beta HCG was undetectable.  I discussed with the patient her negative pregnancy test, and encouraged close OB/GYN follow-up.  Notably, the patient's other symptoms have been present for multiple weeks, and there does not appear to be any red flag findings of any ovarian type pathology.  Patient voiced understanding and felt comfortable with the plan.                Following the history, physical exam, and ED workup, the patient was deemed stable and suitable for discharge. The patient/caregiver was advised to return to the ED for any new or worsening symptoms. Discharge medications, and follow-up instructions were discussed with the patient/caregiver in detail, who verbalizes  understanding. The patient/caregiver is in agreement and is comfortable with the plan of care.    Disposition: Discharged         Current Discharge Medication List      CONTINUE these medications - NO CHANGES were made during your visit.      Details   busPIRone 30 mg Tablet  Commonly known as: BUSPAR   30 mg, Oral, DAILY  Refills: 0     cetirizine 10 mg Tablet  Commonly known as: ZYRTEC   10 mg, Oral, DAILY  Refills: 0     hydrOXYzine pamoate 25 mg Capsule  Commonly known as: VISTARIL   25 mg, Oral, 3 TIMES DAILY PRN  Refills: 0     magnesium oxide 400 mg Tablet  Commonly known as: MAG-OX   400 mg, Oral, 2 TIMES DAILY  Refills: 0     montelukast 10 mg Tablet  Commonly known as: SINGULAIR   10 mg, Oral, EVERY EVENING  Refills: 0     omeprazole 20 mg Capsule,  Delayed Release(E.C.)  Commonly known as: PRILOSEC   20 mg, Oral, DAILY  Refills: 0     topiramate 25 mg Tablet  Commonly known as: TOPAMAX   25 mg, Oral, 2 TIMES DAILY  Refills: 0     venlafaxine 150 mg Capsule, Sust. Release 24 hr  Commonly known as: EFFEXOR XR   150 mg, Oral, DAILY  Refills: 0          Follow up:   [place] [id number]  Call today                 Clinical Impression   Vaginal bleeding (Primary)   Negative pregnancy test         Current Discharge Medication List            R. Baldo Daub, MD, [facility]  Department of Emergency Medicine

## 2021-09-09 ENCOUNTER — Ambulatory Visit (INDEPENDENT_AMBULATORY_CARE_PROVIDER_SITE_OTHER): Payer: Medicaid - Out of State | Admitting: OBSTETRICS/GYNECOLOGY

## 2021-09-09 ENCOUNTER — Encounter (INDEPENDENT_AMBULATORY_CARE_PROVIDER_SITE_OTHER): Payer: Self-pay

## 2022-01-14 LAB — RUBELLA VIRUS ANTIBODIES, IGG, SERUM: RUBELLA IGG QUALITATIVE: IMMUNE

## 2022-01-14 LAB — CHLAMYDIA TRACHOMATIS/NEISSERIA GONORRHOEAE RNA, NAAT
CHLAMYDIA TRACHOMATIS RNA: NEGATIVE
NEISSERIA GONORRHEA GC RNA: NEGATIVE

## 2022-01-14 LAB — HEPATITIS B SURFACE ANTIGEN: HEPATITIS B SURFACE AG: NEGATIVE

## 2022-01-14 LAB — HIV1/HIV2 SCREEN, COMBINED ANTIGEN AND ANTIBODY: HIV SCREEN, COMBINED ANTIGEN & ANTIBODY: NEGATIVE

## 2022-01-14 LAB — SYPHILIS SCREENING ALGORITHM WITH REFLEX, SERUM: SYPHILIS TP ANTIBODIES: NONREACTIVE

## 2022-02-01 NOTE — L&D Delivery Note (Signed)
[place] [facility]ICINE [facility]    Delivery Note  PATIENT:  Leslie Wells   MRN:  [id number]   DATE OF SERVICE:  08/13/2022    PRIMARY OB:  Dr. Rockne Menghini, MD FACOG    Preoperative Diagnosis:  1.  Thirty-nine week 2 day  pregnancy  2.  Term labor 3.  Meconium 4.  Terminal bradycardia                                                Postoperative Diagnosis: 1.  Thirty-nine week 2 day  pregnancy now delivered  2.  Term labor 3.  Meconium 4.  Terminal bradycardia                                                Procedure:  Vacuum assisted vaginal delivery    Surgeon:  Dr Rockne Menghini, MD FACOG    Anesthesia:  None    Complications: None    EBL:  300 cc    Fluids:  Crystalloid    Findings:  Patient had an 8 lb 3.5 oz female infant with Apgars of 8 and 9.  The placenta was delivered intact with a three-vessel cord noted.  The placenta and membranes were meconium stained.    Technique:  Leslie Wells is a 32 y.o. [place] 2 now Para 2 who presented to labor and delivery on 08/13/2022 in active labor.  She was 5-6 cm dilated on presentation.  She rapidly became 9 cm dilated 100% effaced and at +1 station.  She underwent amniotomy and meconium stained fluid was noted.  She then became completely dilated 100% effaced and at +1 approaching +2 station.  She began pushing and the fetal heart rate dropped into the 80s and was not recovering.  The head was ROA and at +2 station.  Decision was made to proceed with a vacuum assisted vaginal delivery.  A kiwi vacuum was applied in the standard fashion and pressure was applied into the green zone.  With the 1st pull the vacuum popped off almost immediately.  The vacuum was then immediately replaced and pressure once again applied into the green zone.  With 1 more easy pull the infant's head was then delivered and the vacuum removed.  The infant's mouth and nose was suctioned on the perineum.  The infant was then delivered and the cord clamped and cut.   The infant was handed off to the mother and the waiting nursing staff.  The infant was not 8 lb 3.5 oz female infant with Apgars of 8 and 9.  Cord blood was obtained.  The placenta was then delivered intact with a three-vessel cord noted.  A small second-degree perineal laceration was encountered and this was repaired under local using interrupted sutures of 3-0 chromic.  Estimated blood loss was 300 cc.  There were no apparent complications.  All sponges, needles, and instruments were accounted for.  Post delivery both the mother and infant were in stable condition.      Rockne Menghini, MD [facility]

## 2022-03-20 ENCOUNTER — Other Ambulatory Visit: Payer: Self-pay

## 2022-03-20 ENCOUNTER — Emergency Department
Admission: EM | Admit: 2022-03-20 | Discharge: 2022-03-20 | Disposition: A | Discharge status: Home / Self Care | Payer: MEDICAID | Attending: Family | Admitting: Family

## 2022-03-20 ENCOUNTER — Encounter (HOSPITAL_BASED_OUTPATIENT_CLINIC_OR_DEPARTMENT_OTHER): Payer: Self-pay

## 2022-03-20 DIAGNOSIS — Z3A18 18 weeks gestation of pregnancy: Secondary | ICD-10-CM | POA: Insufficient documentation

## 2022-03-20 DIAGNOSIS — O98812 Other maternal infectious and parasitic diseases complicating pregnancy, second trimester: Secondary | ICD-10-CM

## 2022-03-20 DIAGNOSIS — B379 Candidiasis, unspecified: Secondary | ICD-10-CM | POA: Insufficient documentation

## 2022-03-20 DIAGNOSIS — O26859 Spotting complicating pregnancy, unspecified trimester: Secondary | ICD-10-CM

## 2022-03-20 DIAGNOSIS — O209 Hemorrhage in early pregnancy, unspecified: Secondary | ICD-10-CM | POA: Insufficient documentation

## 2022-03-20 DIAGNOSIS — O26892 Other specified pregnancy related conditions, second trimester: Secondary | ICD-10-CM | POA: Insufficient documentation

## 2022-03-20 LAB — CBC WITH DIFF
BASOPHIL #: 0.03 10*3/uL (ref 0.00–0.30)
BASOPHIL %: 0 % (ref 0–3)
EOSINOPHIL #: 0.27 10*3/uL (ref 0.00–0.80)
EOSINOPHIL %: 2 % (ref 0–7)
HCT: 37.4 % (ref 37.0–47.0)
HGB: 12.6 g/dL (ref 12.5–16.0)
LYMPHOCYTE #: 2.87 10*3/uL (ref 1.10–5.00)
LYMPHOCYTE %: 18 % — ABNORMAL LOW (ref 25–45)
MCH: 31.2 pg (ref 27.0–32.0)
MCHC: 33.7 g/dL (ref 32.0–36.0)
MCV: 92.5 fL (ref 78.0–99.0)
MONOCYTE #: 1.16 10*3/uL (ref 0.00–1.30)
MONOCYTE %: 7 % (ref 0–12)
MPV: 9.6 fL (ref 7.4–10.4)
NEUTROPHIL #: 11.27 10*3/uL — ABNORMAL HIGH (ref 1.80–8.40)
NEUTROPHIL %: 72 % (ref 40–76)
PLATELETS: 289 10*3/uL (ref 140–440)
RBC: 4.04 10*6/uL — ABNORMAL LOW (ref 4.20–5.40)
RDW: 15 % — ABNORMAL HIGH (ref 11.6–14.8)
WBC: 15.6 10*3/uL — ABNORMAL HIGH (ref 4.0–10.5)

## 2022-03-20 LAB — URINALYSIS, MICROSCOPIC: BACTERIA: NEGATIVE /hpf

## 2022-03-20 LAB — URINALYSIS, MACROSCOPIC
BILIRUBIN: NEGATIVE mg/dL
GLUCOSE: NEGATIVE mg/dL
KETONES: NEGATIVE mg/dL
LEUKOCYTES: NEGATIVE WBCs/uL
NITRITE: NEGATIVE
PH: 6 (ref 4.6–8.0)
PROTEIN: NEGATIVE mg/dL
SPECIFIC GRAVITY: <=1.005 (ref 1.003–1.035)
UROBILINOGEN: 0.2 mg/dL (ref 0.2–1.0)

## 2022-03-20 LAB — COMPREHENSIVE METABOLIC PANEL, NON-FASTING
ALBUMIN/GLOBULIN RATIO: 0.8 (ref 0.8–1.4)
ALBUMIN: 3 g/dL — ABNORMAL LOW (ref 3.4–5.0)
ALKALINE PHOSPHATASE: 71 U/L (ref 46–116)
ALT (SGPT): 20 U/L (ref <=78)
ANION GAP: 10 mmol/L (ref 4–13)
AST (SGOT): 8 U/L — ABNORMAL LOW (ref 15–37)
BILIRUBIN TOTAL: 0.2 mg/dL (ref 0.2–1.0)
BUN/CREA RATIO: 15
BUN: 8 mg/dL (ref 7–18)
CALCIUM, CORRECTED: 10 mg/dL
CALCIUM: 9.2 mg/dL (ref 8.5–10.1)
CHLORIDE: 103 mmol/L (ref 98–107)
CO2 TOTAL: 25 mmol/L (ref 21–32)
CREATININE: 0.52 mg/dL — ABNORMAL LOW (ref 0.55–1.02)
ESTIMATED GFR: 127 mL/min/{1.73_m2} (ref >59)
GLOBULIN: 3.6
GLUCOSE: 97 mg/dL (ref 74–106)
OSMOLALITY, CALCULATED: 274 mOsm/kg (ref 270–290)
POTASSIUM: 4.2 mmol/L (ref 3.5–5.1)
PROTEIN TOTAL: 6.6 g/dL (ref 6.4–8.2)
SODIUM: 138 mmol/L (ref 136–145)

## 2022-03-20 LAB — ABO & RH: ABO/RH(D): A POS

## 2022-03-20 LAB — HCG, PLASMA OR SERUM QUANTITATIVE, PREGNANCY: HCG QUANTITATIVE PREGNANCY: 8049 IU/L — ABNORMAL HIGH (ref <=5)

## 2022-03-20 MED ORDER — CLOTRIMAZOLE 1 % TOPICAL CREAM
TOPICAL_CREAM | Freq: Every day | CUTANEOUS | 0 refills | Status: AC
Start: 2022-03-20 — End: 2022-03-27

## 2022-03-20 NOTE — ED Triage Notes (Addendum)
Patient states at approx 1400 today she had sex, started to spot and has been spotting ever since. Is approx 18.[redacted] weeks pregnant. Also having "very light" lower abdominal cramps. States this is her second baby. Light pink/orange color spotting. States bleeding was heavier at first but has let up now. States she also has vaginal irritation, is on antibiotics for an upper respiratory infection. States she thinks she also has a yeast infection.

## 2022-03-20 NOTE — ED Nurses Note (Signed)
Fetal heart tones with doppler performed.

## 2022-03-20 NOTE — ED Provider Notes (Signed)
[facility] Emergency Department  ED Primary Provider Note  History of Present Illness   Chief Complaint   Patient presents with    Vaginal Bleeding - Pregnant     Leslie Wells is a 32 y.o. female who had concerns including Vaginal Bleeding - Pregnant.  Arrival: The patient arrived by Car    Patient is G 2p1 [redacted] week pregnant female to the emergency room with vaginal spotting.  Patient states she had intercourse approximally 4 hours prior to arrival and noticed bleeding immediately after.  Patient states she was bleeding when she wiped with bright red blood on the toilet paper.  Patient states since then she has slowly started to decrease and now has light pink spotting.  Patient states additionally she has some lower abdominal cramping.  Patient denies any passing of tissue or clots.      History Reviewed This Encounter: Medical History  Surgical History  Family History  Social History    Physical Exam   ED Triage Vitals [03/20/22 1807]   BP (Non-Invasive) 127/83   Heart Rate 98   Respiratory Rate 20   Temperature 36.2 C (97.2 F)   SpO2 99 %   Weight 95.3 kg (210 lb)   Height 1.6 m (5' 3"$ )     Physical Exam  Vitals and nursing note reviewed.   Constitutional:       General: She is not in acute distress.     Appearance: She is well-developed.   HENT:      Head: Normocephalic and atraumatic.   Eyes:      Conjunctiva/sclera: Conjunctivae normal.   Cardiovascular:      Rate and Rhythm: Normal rate and regular rhythm.      Heart sounds: No murmur heard.  Pulmonary:      Effort: Pulmonary effort is normal. No respiratory distress.      Breath sounds: Normal breath sounds.   Abdominal:      Palpations: Abdomen is soft.      Tenderness: There is no abdominal tenderness.   Musculoskeletal:         General: No swelling.      Cervical back: Neck supple.   Skin:     General: Skin is warm and dry.      Capillary Refill: Capillary refill takes less than 2 seconds.    Neurological:      Mental Status: She is alert.   Psychiatric:         Mood and Affect: Mood normal.       Patient Data     Labs Ordered/Reviewed   COMPREHENSIVE METABOLIC PANEL, NON-FASTING - Abnormal; Notable for the following components:       Result Value    CREATININE 0.52 (*)     ALBUMIN 3.0 (*)     AST (SGOT) 8 (*)     All other components within normal limits    Narrative:     Estimated Glomerular Filtration Rate (eGFR) is calculated using the CKD-EPI (2021) equation, intended for patients 45 years of age and older. If gender is not documented or "unknown", there will be no eGFR calculation.   HCG, PLASMA OR SERUM QUANTITATIVE, PREGNANCY - Abnormal; Notable for the following components:    HCG QUANTITATIVE PREGNANCY 8,049 (*)     All other components within normal limits    Narrative:  HCG EXPECTED VALUES    NON PREGNANT FEMALE  FEMALE ADULT    THE CONCENTRATION OF HCG RISES RAPIDLY DURING EARLY PREGNANCY. A MAXIMUM LEVEL OF 5,000 to 200,000 mIU/mL IS REACHED AT 10-12 WEEKS, FOLLOWED BY A SLOW DECLINE TO LEVELS OF 1,000 TO 50,000 mIU/mL DURING THE THIRD TRIMESTER.    HCG RESULTS BETWEEN 5 mIU/mL AND 25 mIU/mL MAY BE INDICATIVE OF EARLY PREGNANCY BUT SHOULD BE INTERPRETED IN LIGHT OF THE TOTAL CLINICAL PRESENTATION OF THE PATIENT.    WHEN BORDERLINE RESULTS ARE ENCOUNTERED, PATIENT SAMPLES SHOULD BE REDRAWM 48 HOURS LATER.    Weeks Post LMP                   Approximate hCG                                    Range (mIU/mL)    0.2-1 WK                           5-50              mIU/mL        1-2   WK                           50-500            mIU/mL  2-3   WK                           100-5,000         mIU/mL  3-4   WK                           500-10,000        mIU/mL  4-5   WK                           1,000-50,000      mIU/mL  5-6   WK                           10,000-100,000    mIU/mL  6-8   WK                           15,000-200,000    mIU/mL  2-3   MONTHS                        10,000-100,000    mIU/mL    ref: Clinical Guide to Laboratory Tests 3rd Edition Terrilee Croak.     CBC WITH DIFF - Abnormal; Notable for the following components:    WBC 15.6 (*)     RBC 4.04 (*)     RDW 15.0 (*)     LYMPHOCYTE % 18 (*)     NEUTROPHIL # 11.27 (*)     All other components within normal limits   URINALYSIS, MACROSCOPIC - Abnormal; Notable for the following components:    COLOR Light Yellow (*)     BLOOD Small (*)     All other components within normal limits   URINALYSIS, MICROSCOPIC - Abnormal; Notable for  the following components:    MUCOUS Rare (*)     All other components within normal limits   CBC/DIFF    Narrative:     The following orders were created for panel order CBC/DIFF.  Procedure                               Abnormality         Status                     ---------                               -----------         ------                     CBC WITH DIFF[[id number]]                Abnormal            Final result                 Please view results for these tests on the individual orders.   URINALYSIS, MACROSCOPIC AND MICROSCOPIC W/CULTURE REFLEX    Narrative:     The following orders were created for panel order URINALYSIS, MACROSCOPIC AND MICROSCOPIC W/CULTURE REFLEX.  Procedure                               Abnormality         Status                     ---------                               -----------         ------                     URINALYSIS, MACROSCOPIC[[id number]]      Abnormal            Final result               URINALYSIS, MICROSCOPIC[[id number]]      Abnormal            Final result                 Please view results for these tests on the individual orders.   ABO & RH     No orders to display     Medical Decision Making      Medical Decision Making  Patient is G 2p1 [redacted] week pregnant female to the emergency room with vaginal spotting.  Pelvic exam performed and cervix closed with a thick, white discharge.  Patient is currently on cefdinir and states she  typically gets yeast infections with antibiotic treatment.  Abdomen is soft and nontender to palpation.  Fetal heart tones 160 beats per minute.  HCG within normal limits for [redacted] week pregnant female.  Urinalysis shows no infectious process or abnormalities.  WBCs at 15.6.  Discussed with Dr. Buford Dresser, OBGYN and he states this is nothing to be concerned about an [redacted] week pregnant female.  Patient has no vaginal bleeding on pelvic exam or in emergency department  as is completely stopped.  Discussed with OBGYN, Dr. Buford Dresser and he states no sexual intercourse with bleeding, but does not recommend transvaginal ultrasound as patient cervix is closed and bleeding has discontinued.  Patient discharged with Lotrimin intravaginal x5 days or yeast infection.  Patient otherwise to follow up with Dr. Johnnette Gourd as scheduled and return if any worsening symptoms.    Amount and/or Complexity of Data Reviewed  Labs: ordered.  ECG/medicine tests: independent interpretation performed.      ED Course as of 03/20/22 2143   Sat Mar 20, 2022   2028 WBC(!): 15.6            Clinical Impression   Spotting affecting pregnancy (Primary)   Yeast infection       Disposition: Discharged

## 2022-03-20 NOTE — ED Nurses Note (Signed)
Provider performed pelvic exam. Patient tolerated well.

## 2022-04-17 ENCOUNTER — Encounter (HOSPITAL_COMMUNITY): Payer: Self-pay

## 2022-05-24 ENCOUNTER — Other Ambulatory Visit: Payer: MEDICAID | Attending: OBSTETRICS/GYNECOLOGY | Admitting: OBSTETRICS/GYNECOLOGY

## 2022-05-24 ENCOUNTER — Other Ambulatory Visit: Payer: Self-pay

## 2022-05-24 DIAGNOSIS — Z3A27 27 weeks gestation of pregnancy: Secondary | ICD-10-CM

## 2022-05-24 DIAGNOSIS — Z3689 Encounter for other specified antenatal screening: Secondary | ICD-10-CM | POA: Insufficient documentation

## 2022-05-24 LAB — MANUAL DIFFERENTIAL
BAND %: 1 % — ABNORMAL LOW (ref 5–11)
BANDS NEUTROPHILS MANUAL: 1
EOSINOPHIL %: 1 % (ref 0–7)
EOSINOPHIL ABSOLUTE: 0.15 10*3/uL (ref 0.00–0.80)
EOSINOPHILS MANUAL: 1
LYMPHOCYTE %: 17 % — ABNORMAL LOW (ref 25–45)
LYMPHOCYTE ABSOLUTE: 2.53 10*3/uL (ref 1.10–5.00)
LYMPHOCYTES MANUAL: 17
MONOCYTE %: 1 % (ref 0–12)
MONOCYTE ABSOLUTE: 0.15 10*3/uL (ref 0.00–1.30)
MONOCYTES MANUAL: 1
MYELOCYTE %: 1 %
MYELOCYTE ABSOLUTE: 0.15 10*3/uL
MYELOCYTES MANUAL: 1
NEUTROPHIL %: 79 % — ABNORMAL HIGH (ref 40–76)
NEUTROPHIL ABSOLUTE: 11.92 10*3/uL — ABNORMAL HIGH (ref 1.80–8.40)
NEUTROPHILS MANUAL: 79
PLATELET MORPHOLOGY COMMENT: NORMAL
RBC MORPHOLOGY COMMENT: NORMAL
TOTAL CELLS COUNTED [#] IN BLOOD: 100
WBC: 14.9 10*3/uL

## 2022-05-24 LAB — CBC WITH DIFF
HCT: 34.5 % (ref 31.2–41.9)
HGB: 11.4 g/dL (ref 10.9–14.3)
MCH: 29.9 pg (ref 24.7–32.8)
MCHC: 33.1 g/dL (ref 32.3–35.6)
MCV: 90.2 fL (ref 75.5–95.3)
MPV: 9.9 fL (ref 7.9–10.8)
PLATELETS: 230 10*3/uL (ref 140–440)
RBC: 3.83 10*6/uL (ref 3.63–4.92)
RDW: 13.6 % (ref 12.3–17.7)
WBC: 14.9 10*3/uL — ABNORMAL HIGH (ref 3.8–11.8)

## 2022-05-24 LAB — SYPHILIS SCREENING ALGORITHM WITH REFLEX, SERUM: SYPHILIS TP ANTIBODIES: NONREACTIVE

## 2022-05-24 LAB — GLUCOSE TOLERANCE TEST (GTT), 1 HOUR: GLUCOSE 1 HR POST DOSE: 124 mg/dL (ref 120–170)

## 2022-05-27 LAB — SYPHILIS, RAPID PLASMIN REAGIN (RPR), WITH TITER REFLEX: RPR QUALITATIVE: NONREACTIVE

## 2022-07-28 ENCOUNTER — Other Ambulatory Visit: Payer: Self-pay

## 2022-07-28 ENCOUNTER — Other Ambulatory Visit: Payer: MEDICAID | Attending: OBSTETRICS/GYNECOLOGY | Admitting: OBSTETRICS/GYNECOLOGY

## 2022-07-28 DIAGNOSIS — Z3A36 36 weeks gestation of pregnancy: Secondary | ICD-10-CM | POA: Insufficient documentation

## 2022-07-28 DIAGNOSIS — Z3685 Encounter for antenatal screening for Streptococcus B: Secondary | ICD-10-CM | POA: Insufficient documentation

## 2022-07-29 LAB — GROUP B STREPTOCOCCUS DNA BY NAAT: GROUP B STREPTOCOCCUS (GBS) DNA BY NAAT: NEGATIVE

## 2022-08-12 NOTE — H&P (Signed)
[facility]    LABOR & DELIVERY  ADMISSION HISTORY AND PHYSICAL    PCP:  Carlota Raspberry, PA  Referring Physician:  Ashley Royalty,*  PATIENT:  Leslie Wells   MRN:  [id number]   DATE OF SERVICE:  08/13/2022    PRIMARY OB:  Dr. Rockne Menghini, MD FACOG      HPI:  Leslie Wells is a 32 y.o. gravida 2 para 1 with an EDC of 08/18/2022 currently at 39 weeks 2 days gestation who presents to labor and delivery today with irregular uterine contractions.  She is being admitted for labor augmentation.  Pregnancy is significant for a large for gestational age infant.  Last ultrasound showed an estimated fetal weight of 7 lb 12 oz on June 25th which was at greater than 90th percentile.  Pregnancy has been otherwise unremarkable.        OBHx:  Lab Results   Component Value Date    ABORHD A POSITIVE 03/20/2022    HGB 11.4 05/24/2022    HCT 34.5 05/24/2022       Past OB Hx:  In February of 2020 patient had a vacuum assisted vaginal delivery of a 6 lb 8 oz female infant at 38 weeks 5 days gestation    PMHx:  Migraine headaches  Anxiety  Gastroesophageal reflux disease  History of melanoma    PSHx:  Melanoma removal  Tonsillectomy and adenoidectomy    CURRENT MEDS:    Cannot display prior to admission medications because the patient has not been admitted in this contact.           Prenatal vitamins 1 p.o. daily    ALLERGIES:  Amoxicillin and Bactrim [sulfamethoxazole-trimethoprim]     SocHx:  She does not drink alcohol.  She does not smoke cigarettes.  She does not use illicit drugs.      FamHx:   Noncontributory    REVIEW OF SYSTEMS:   All systems are negative to review    PHYSICAL EXAMINATION:   There were no vitals filed for this visit.  Patient's temperature is 98, blood pressure is 110/72, pulse is 80, respiratory rate is 16, fetal heart tones are auscultated normally  HEENT:  Within normal limits  Neck:  Is supple with full range of motion there is no palpable  thyroid nodules  Heart:  Regular rate and rhythm without murmur rub or gallop  Lungs:  Clear to auscultation bilaterally  Abdomen:  Fundal height is 39 cm.  Abdomen is soft and nontender.  Extremities:  There is no clubbing cyanosis or edema noted.  Pelvic exam:  Patient's external genitalia is without lesion.  Urethra and urethral meatus appear normal.  Vagina is pink with normal mucosa.  Cervix is without lesion.  Cervix is  2 cm dilated 60 % effaced and -1 station.  Anus and perineum appeared normal.    Impression:  Leslie Wells  is a 32 y.o. gravida 2 para 1 currently at 30 weeks 2 days gestation with irregular uterine contractions and large for gestational age infant    Plan:  [name] will be admitted to Labor and delivery.  Her labor will be augmented using misoprostol.  She is group B strep negative.  Blood type is A positive.  PNC  A POSITIVE.  Her 1st prenatal visit was January 14, 2022.  She had 11 prenatal visits.  Her last prenatal visit was August 09, 2022.  Ashley Royalty, MD.FACOG

## 2022-08-13 ENCOUNTER — Inpatient Hospital Stay
Admission: EM | Admit: 2022-08-13 | Discharge: 2022-08-14 | DRG: 560 | Disposition: A | Discharge status: Home / Self Care | Payer: MEDICAID | Source: Ambulatory Visit | Attending: OBSTETRICS/GYNECOLOGY | Admitting: OBSTETRICS/GYNECOLOGY

## 2022-08-13 ENCOUNTER — Other Ambulatory Visit: Payer: Self-pay

## 2022-08-13 ENCOUNTER — Encounter (HOSPITAL_COMMUNITY): Payer: Self-pay | Admitting: OBSTETRICS/GYNECOLOGY

## 2022-08-13 ENCOUNTER — Ambulatory Visit (HOSPITAL_COMMUNITY): Payer: Self-pay

## 2022-08-13 ENCOUNTER — Inpatient Hospital Stay (HOSPITAL_COMMUNITY): Payer: MEDICAID | Admitting: OBSTETRICS/GYNECOLOGY

## 2022-08-13 DIAGNOSIS — K219 Gastro-esophageal reflux disease without esophagitis: Secondary | ICD-10-CM | POA: Diagnosis present

## 2022-08-13 DIAGNOSIS — Z79899 Other long term (current) drug therapy: Secondary | ICD-10-CM

## 2022-08-13 DIAGNOSIS — Z3A39 39 weeks gestation of pregnancy: Secondary | ICD-10-CM

## 2022-08-13 DIAGNOSIS — O9962 Diseases of the digestive system complicating childbirth: Secondary | ICD-10-CM | POA: Diagnosis present

## 2022-08-13 DIAGNOSIS — Z23 Encounter for immunization: Secondary | ICD-10-CM

## 2022-08-13 DIAGNOSIS — Z3490 Encounter for supervision of normal pregnancy, unspecified, unspecified trimester: Principal | ICD-10-CM | POA: Diagnosis present

## 2022-08-13 DIAGNOSIS — O3663X0 Maternal care for excessive fetal growth, third trimester, not applicable or unspecified: Principal | ICD-10-CM | POA: Diagnosis present

## 2022-08-13 LAB — TYPE AND SCREEN
ABO/RH(D): A POS
ANTIBODY SCREEN: NEGATIVE

## 2022-08-13 LAB — CBC
HCT: 36.5 % (ref 31.2–41.9)
HGB: 12.3 g/dL (ref 10.9–14.3)
MCH: 29.4 pg (ref 24.7–32.8)
MCHC: 33.8 g/dL (ref 32.3–35.6)
MCV: 87 fL (ref 75.5–95.3)
MPV: 10 fL (ref 7.9–10.8)
PLATELETS: 245 10*3/uL (ref 140–440)
RBC: 4.2 10*6/uL (ref 3.63–4.92)
RDW: 14.7 % (ref 12.3–17.7)
WBC: 21.5 10*3/uL — ABNORMAL HIGH (ref 3.8–11.8)

## 2022-08-13 MED ORDER — MISOPROSTOL 500 MCG TABLET
1000.0000 ug | ORAL_TABLET | Freq: Once | ORAL | Status: DC | PRN
Start: 2022-08-13 — End: 2022-08-14

## 2022-08-13 MED ORDER — MODIFIED LANOLIN 100 % TOPICAL CREAM
TOPICAL_CREAM | CUTANEOUS | Status: AC | PRN
Start: 2022-08-13 — End: ?
  Filled 2022-08-13: qty 7

## 2022-08-13 MED ORDER — PRENATAL VIT-IRON-FOLATE TAB WRAPPER
1.0000 | ORAL_TABLET | Freq: Every day | Status: DC
Start: 2022-08-13 — End: 2022-08-14
  Administered 2022-08-13 – 2022-08-14 (×2): 1 via ORAL
  Filled 2022-08-13 (×2): qty 1

## 2022-08-13 MED ORDER — CARBOPROST TROMETHAMINE 250 MCG/ML INTRAMUSCULAR SOLUTION
250.0000 ug | Freq: Once | INTRAMUSCULAR | Status: DC | PRN
Start: 2022-08-13 — End: 2022-08-14

## 2022-08-13 MED ORDER — LACTATED RINGERS INTRAVENOUS SOLUTION
INTRAVENOUS | Status: DC
Start: 2022-08-13 — End: 2022-08-14

## 2022-08-13 MED ORDER — MISOPROSTOL 25 MCG QUARTER TABLET
25.0000 ug | ORAL_TABLET | ORAL | Status: DC | PRN
Start: 2022-08-13 — End: 2022-08-13

## 2022-08-13 MED ORDER — MEASLES,MUMPS,RUBELLA VACCINE LIVE(PF)1,000-12,500TCID50/0.5 ML SUBCUT
0.5000 mL | Freq: Once | SUBCUTANEOUS | Status: DC
Start: 2022-08-14 — End: 2022-08-14
  Administered 2022-08-14: 0 mL via SUBCUTANEOUS

## 2022-08-13 MED ORDER — DOCUSATE SODIUM 100 MG CAPSULE
100.0000 mg | ORAL_CAPSULE | Freq: Two times a day (BID) | ORAL | Status: DC | PRN
Start: 2022-08-13 — End: 2022-08-14
  Administered 2022-08-13 – 2022-08-14 (×2): 100 mg via ORAL
  Filled 2022-08-13 (×2): qty 1

## 2022-08-13 MED ORDER — BENZOCAINE 20 %-MENTHOL 0.5 % TOPICAL AEROSOL
1.0000 | INHALATION_SPRAY | Freq: Four times a day (QID) | CUTANEOUS | Status: DC | PRN
Start: 2022-08-13 — End: 2022-08-14
  Administered 2022-08-13: 1 via TOPICAL

## 2022-08-13 MED ORDER — METHYLERGONOVINE 0.2 MG/ML (1 ML) INJECTION SOLUTION
0.2000 mg | Freq: Once | INTRAMUSCULAR | Status: DC | PRN
Start: 2022-08-13 — End: 2022-08-14

## 2022-08-13 MED ORDER — ACETAMINOPHEN 325 MG TABLET
650.0000 mg | ORAL_TABLET | ORAL | Status: DC | PRN
Start: 2022-08-13 — End: 2022-08-14
  Administered 2022-08-14 (×2): 650 mg via ORAL
  Filled 2022-08-13 (×2): qty 2

## 2022-08-13 MED ORDER — OXYTOCIN 10 UNIT/ML INJECTION SOLUTION
INTRAMUSCULAR | Status: AC
Start: 2022-08-13 — End: 2022-08-14
  Filled 2022-08-13: qty 3

## 2022-08-13 MED ORDER — IBUPROFEN 800 MG TABLET
800.0000 mg | ORAL_TABLET | Freq: Three times a day (TID) | ORAL | Status: DC | PRN
Start: 2022-08-13 — End: 2022-08-14
  Administered 2022-08-13 – 2022-08-14 (×2): 800 mg via ORAL
  Filled 2022-08-13 (×2): qty 1

## 2022-08-13 MED ORDER — ONDANSETRON HCL (PF) 4 MG/2 ML INJECTION SOLUTION
4.0000 mg | Freq: Four times a day (QID) | INTRAMUSCULAR | Status: DC | PRN
Start: 2022-08-13 — End: 2022-08-14

## 2022-08-13 MED ORDER — SODIUM CHLORIDE 0.9 % (FLUSH) INJECTION SYRINGE
3.0000 mL | INJECTION | Freq: Three times a day (TID) | INTRAMUSCULAR | Status: DC
Start: 2022-08-13 — End: 2022-08-14
  Administered 2022-08-13: 0 mL

## 2022-08-13 MED ORDER — HYDROCODONE 10 MG-ACETAMINOPHEN 325 MG TABLET
1.0000 | ORAL_TABLET | ORAL | Status: DC | PRN
Start: 2022-08-13 — End: 2022-08-14

## 2022-08-13 MED ORDER — SODIUM CHLORIDE 0.9 % (FLUSH) INJECTION SYRINGE
3.0000 mL | INJECTION | INTRAMUSCULAR | Status: DC | PRN
Start: 2022-08-13 — End: 2022-08-14

## 2022-08-13 MED ORDER — BENZOCAINE 20 %-MENTHOL 0.5 % TOPICAL AEROSOL
1.0000 | INHALATION_SPRAY | Freq: Once | CUTANEOUS | Status: DC
Start: 2022-08-13 — End: 2022-08-14
  Filled 2022-08-13: qty 85

## 2022-08-13 MED ORDER — ZOLPIDEM 5 MG TABLET
5.0000 mg | ORAL_TABLET | Freq: Every evening | ORAL | Status: DC | PRN
Start: 2022-08-13 — End: 2022-08-14

## 2022-08-13 MED ORDER — LACTATED RINGERS INTRAVENOUS SOLUTION
INTRAVENOUS | Status: AC
Start: 2022-08-13 — End: ?

## 2022-08-13 MED ORDER — LIDOCAINE HCL 10 MG/ML (1 %) INJECTION SOLUTION
10.0000 mL | Freq: Once | INTRAMUSCULAR | Status: DC | PRN
Start: 2022-08-13 — End: 2022-08-14
  Filled 2022-08-13: qty 20

## 2022-08-13 MED ORDER — OXYTOCIN 10 UNIT/ML INJECTION SOLUTION
INTRAMUSCULAR | Status: AC
Start: 2022-08-13 — End: 2022-08-13

## 2022-08-13 MED ORDER — BUTORPHANOL 2 MG/ML INJECTION SOLUTION
2.0000 mg | INTRAMUSCULAR | Status: DC | PRN
Start: 2022-08-13 — End: 2022-08-14

## 2022-08-13 MED ORDER — GLYCERIN-WITCH HAZEL 12.5 %-50 % TOPICAL PADS
MEDICATED_PAD | CUTANEOUS | Status: DC | PRN
Start: 2022-08-13 — End: 2022-08-14
  Filled 2022-08-13: qty 40

## 2022-08-13 MED ORDER — LACTATED RINGERS IV BOLUS
500.0000 mL | INJECTION | Status: DC | PRN
Start: 2022-08-13 — End: 2022-08-14

## 2022-08-13 NOTE — Care Plan (Signed)
Problem: Adult Inpatient Plan of Care  Goal: Plan of Care Review  Outcome: Ongoing (see interventions/notes)  Goal: Patient-Specific Goal (Individualized)  Outcome: Ongoing (see interventions/notes)  Flowsheets (Taken 08/13/2022 0817)  Individualized Care Needs: To be offered support during labor and delivery.  Anxieties, Fears or Concerns: Not getting an epidural.  Patient-Specific Goals (Include Timeframe): To have a safe delivery and healthy baby.  Goal: Absence of Hospital-Acquired Illness or Injury  Outcome: Ongoing (see interventions/notes)  Goal: Optimal Comfort and Wellbeing  Outcome: Ongoing (see interventions/notes)  Goal: Rounds/Family Conference  Outcome: Ongoing (see interventions/notes)     Problem: Labor  Goal: Hemostasis  Outcome: Ongoing (see interventions/notes)  Goal: Stable Fetal Wellbeing  Outcome: Ongoing (see interventions/notes)  Goal: Effective Progression to Delivery  Outcome: Ongoing (see interventions/notes)  Goal: Absence of Infection Signs and Symptoms  Outcome: Ongoing (see interventions/notes)  Goal: Acceptable Pain Control  Outcome: Ongoing (see interventions/notes)  Goal: Normal Uterine Contraction Pattern  Outcome: Ongoing (see interventions/notes)

## 2022-08-13 NOTE — Care Plan (Signed)
Problem: Adult Inpatient Plan of Care  Goal: Plan of Care Review  08/13/2022 0858 by Hulan Amato, RN  Outcome: Ongoing (see interventions/notes)  08/13/2022 0820 by Hulan Amato, RN  Outcome: Ongoing (see interventions/notes)  Goal: Patient-Specific Goal (Individualized)  08/13/2022 0858 by Hulan Amato, RN  Outcome: Ongoing (see interventions/notes)  08/13/2022 0820 by Hulan Amato, RN  Outcome: Ongoing (see interventions/notes)  Flowsheets (Taken 08/13/2022 0817)  Individualized Care Needs: To be offered support during labor and delivery.  Anxieties, Fears or Concerns: Not getting an epidural.  Patient-Specific Goals (Include Timeframe): To have a safe delivery and healthy baby.  Goal: Absence of Hospital-Acquired Illness or Injury  08/13/2022 0858 by Hulan Amato, RN  Outcome: Ongoing (see interventions/notes)  08/13/2022 0820 by Hulan Amato, RN  Outcome: Ongoing (see interventions/notes)  Goal: Optimal Comfort and Wellbeing  08/13/2022 0858 by Hulan Amato, RN  Outcome: Ongoing (see interventions/notes)  08/13/2022 0820 by Hulan Amato, RN  Outcome: Ongoing (see interventions/notes)  Goal: Rounds/Family Conference  08/13/2022 0858 by Hulan Amato, RN  Outcome: Ongoing (see interventions/notes)  08/13/2022 0820 by Hulan Amato, RN  Outcome: Ongoing (see interventions/notes)     Problem: Labor  Goal: Hemostasis  08/13/2022 0858 by Hulan Amato, RN  Outcome: Outcome Achieved  08/13/2022 0820 by Hulan Amato, RN  Outcome: Ongoing (see interventions/notes)  Goal: Stable Fetal Wellbeing  08/13/2022 0858 by Hulan Amato, RN  Outcome: Outcome Achieved  08/13/2022 0820 by Hulan Amato, RN  Outcome: Ongoing (see interventions/notes)  Goal: Effective Progression to Delivery  08/13/2022 0858 by Hulan Amato, RN  Outcome: Outcome Achieved  08/13/2022 0820 by Hulan Amato, RN  Outcome: Ongoing (see interventions/notes)  Goal: Absence of  Infection Signs and Symptoms  08/13/2022 0858 by Hulan Amato, RN  Outcome: Outcome Achieved  08/13/2022 0820 by Hulan Amato, RN  Outcome: Ongoing (see interventions/notes)  Goal: Acceptable Pain Control  08/13/2022 0858 by Hulan Amato, RN  Outcome: Outcome Achieved  08/13/2022 0820 by Hulan Amato, RN  Outcome: Ongoing (see interventions/notes)  Goal: Normal Uterine Contraction Pattern  08/13/2022 0858 by Hulan Amato, RN  Outcome: Outcome Achieved  08/13/2022 0820 by Hulan Amato, RN  Outcome: Ongoing (see interventions/notes)     Problem: Postpartum (Vaginal Delivery)  Goal: Successful Maternal Role Transition  Outcome: Ongoing (see interventions/notes)  Goal: Hemostasis  Outcome: Ongoing (see interventions/notes)  Goal: Absence of Infection Signs and Symptoms  Outcome: Ongoing (see interventions/notes)  Goal: Anesthesia/Sedation Recovery  Outcome: Ongoing (see interventions/notes)  Goal: Optimal Pain Control and Function  Outcome: Ongoing (see interventions/notes)  Goal: Effective Urinary Elimination  Outcome: Ongoing (see interventions/notes)

## 2022-08-14 LAB — H & H
HCT: 33.4 % (ref 31.2–41.9)
HGB: 11.3 g/dL (ref 10.9–14.3)

## 2022-08-14 LAB — SYPHILIS SCREENING ALGORITHM WITH REFLEX, SERUM: SYPHILIS TP ANTIBODIES: NONREACTIVE

## 2022-08-14 MED ORDER — DIPHTH,PERTUSSIS(ACEL),TETANUS 2.5 LF UNIT-8 MCG-5 LF/0.5ML IM SYRINGE
0.5000 mL | INJECTION | Freq: Once | INTRAMUSCULAR | Status: AC
Start: 2022-08-14 — End: 2022-08-14
  Administered 2022-08-14: 0.5 mL via INTRAMUSCULAR
  Filled 2022-08-14: qty 0.5

## 2022-08-14 MED ORDER — IBUPROFEN 800 MG TABLET
800.0000 mg | ORAL_TABLET | Freq: Three times a day (TID) | ORAL | 5 refills | Status: AC | PRN
Start: 2022-08-14 — End: ?

## 2022-08-14 NOTE — Progress Notes (Signed)
[facility]  OB POSTPARTUM PROGRESS NOTE      Weisenburger, Yardley Labianca  Date of Admission:  08/13/2022  Date of Birth:  09-11-1990  MRN:  [id number]  CSN:   [id number]  Date of Service:  08/14/2022        Subjective:   Patient is without complaint today.    Vital Signs:    Temp (24hrs) Max:36.9 C (98.4 F)    Temp (48hrs) Max:36.9 C (98.4 F)    Temperature: 36.8 C (98.3 F)  BP (Non-Invasive): 125/65  MAP (Non-Invasive): 90 mmHG  Heart Rate: 85  Respiratory Rate: 18    I/O:  I/O last 24 hours:    Intake/Output Summary (Last 24 hours) at 08/14/2022 0944  Last data filed at 08/13/2022 1500  Gross per 24 hour   Intake --   Output 550 ml   Net -550 ml     I/O current shift:  No intake/output data recorded.    Physical Exam:  General: NAD  Chest: RRR, lungs clear to auscultation   Abdomen:  NABS, soft, non tender and non distended.    GU: Perineum intact without erythema  Fundus:  Firm and NT  Lochia: Normal   Extremities: Edema is noted.  No calf tenderness,  Negative homans    Labs:  Results for orders placed or performed during the hospital encounter of 08/13/22 (from the past 24 hour(s))   H & H IF DELIVERY OCCURED BEFORE 00:01   Result Value Ref Range    HGB 11.3 10.9 - 14.3 g/dL    HCT 95.6 21.3 - 08.6 %        Radiology:         Assessment:  Active Hospital Problems   (*Primary Problem)    Diagnosis    *Pregnancy     32 y.o. [id number] NSVD # 1 (Vacuum)  Plan:  Routine Post partum care.    Rockne Menghini, MD [facility]

## 2022-08-14 NOTE — Nurses Notes (Signed)
Patient discharged home with family.  AVS reviewed with patient/care giver.  A written copy of the AVS and discharge instructions was given to the patient/care giver.  Questions sufficiently answered as needed.  Patient/care giver encouraged to follow up with PCP as indicated.  In the event of an emergency, patient/care giver instructed to call 911 or go to the nearest emergency room.

## 2022-08-14 NOTE — Discharge Summary (Signed)
DISCHARGE SUMMARY      Patient Name: Leslie Wells  Sex: Female  Date of Birth: [birth year]  MRN Number: [id number]  Discharge Weight: Weight: 113 kg (250 lb)    Admission Date: 08/13/2022  Discharge Date: 08/14/2022    Discharge Attending: Ashley Royalty, MD    Primary Care Physician: Carlota Raspberry, PA     Reason for Admission       Diagnosis        Pregnancy [[id number]]          Discharge Diagnoses:  1.  39 week 2 day pregnancy now delivered 2.  Term labor 3.  Meconium 4.  Terminal bradycardia 5.  Status post vacuum assisted vaginal delivery     Principal Problem: Pregnancy       Active Hospital Problems    Diagnosis    Primary Problem: Pregnancy     There are no active non-hospital problems to display for this patient.     Allergies   Allergen Reactions    Amoxicillin Hives/ Urticaria    Bactrim [Sulfamethoxazole-Trimethoprim] Itching       Reason for Hospitalization and Hospital Course:  Leslie Wells is a 32 y.o. female gravida 2 now para 2 who presented to Labor and delivery on 08/13/2022 at 39 weeks 2 days gestation in active labor.  Her labor did progress rapidly.  At amniotomy, meconium was noted.  She developed terminal bradycardia while pushing and proceeded to have a vacuum assisted vaginal delivery of an 8 lb 3.5 oz female infant with Apgars of 8 and 9.  Postpartum course was unremarkable.  At the time of discharge, she could ambulate independently.  She had full self-care ability.  Her cognitive status was alert and oriented.    Discharge Medications:     Current Discharge Medication List        START taking these medications.        Details   Ibuprofen 800 mg Tablet  Commonly known as: MOTRIN   800 mg, Oral, 3 TIMES DAILY PRN  Qty: 90 Tablet  Refills: 5            CONTINUE these medications - NO CHANGES were made during your visit.        Details   omeprazole 20 mg Capsule, Delayed Release(E.C.)  Commonly known as: PRILOSEC   20 mg, Oral, DAILY  Refills: 0     PRENATAL COMPLETE ORAL    Oral  Refills: 0              Follow-Up Appointments/Discharge Instructions:  Post-Discharge Follow Up Appointments       Follow up with Ashley Royalty, MD in 6 week(s)    Phone: [phone]    Where: [address], [place] [address]             DISCHARGE INSTRUCTION - ACTIVITY    LIGHT ACTIVITY, NO LIFTING, PELVIC REST, NO BATHS, NO SWIMMING, NO DRIVING FOR 2 WEEKS; PELVIC REST--NOTHING VAGINALLY UNTIL CLEARED BY THE PHYSICIAN     Activity: AS INSTRUCTED      DISCHARGE INSTRUCTION - DIET - RESUME HOME DIET     Diet: RESUME HOME DIET      DISCHARGE INSTRUCTION - PERI CARE WITH STITCHES    Peri-care: with stitches, use peribottle with clean, warm water each time you use the bathroom x 1 week.             Condition on Discharge:   A.  Condition- Stable   B. Activity- As tolerated, Pelvic rest for 6 weeks   C. Diet- Oral: Regular                 Advance Directive Information      Flowsheet Row Most Recent Value   Does the Patient have an Advance Directive? No, Information Offered and Refused              Disposition- Home discharge             Ashley Royalty, MD, FACOG    Copies sent to Care Team         Relationship Specialty Notifications Start End    [place], Benito Mccreedy, [place] PCP - General PHYSICIAN ASSISTANT  03/20/22     Phone: [phone] Fax: [phone]         [address] [place] [address]            Referring providers can utilize [email or web address] to access their referred [facility] patient's information.

## 2022-08-14 NOTE — Discharge Instructions (Signed)
Call your own healthcare provider if you have any of these:   Burning feeling or pain in your breasts  Red streaks or hard lumpy areas in your breasts  Problems with breastfeeding  A fever of 100.4F (38C) or higher, or as advised by your healthcare provider  Extreme tiredness or body aches, as if you have the flu  Pain, fluid, or bleeding from a cesarean incision  Feelings of being very sad or anxious  Feeling that you don't want to be with your baby  Belly (abdominal) pain that isn't eased with medicine  Fluid from your vagina that has a bad smell  Vaginal bleeding that soaks more than 1 pad per hour, or large blood clots the size of a large egg  Seizures  Severe headache  Swelling in your face or limbs  [facility] : [phone]
